# Patient Record
Sex: Male | Born: 1965
Health system: Southern US, Community
[De-identification: ages and names within clinical notes are randomized; demographics above are authoritative.]

## PROBLEM LIST (undated history)

## (undated) DIAGNOSIS — Z8489 Family history of other specified conditions: Secondary | ICD-10-CM

## (undated) DIAGNOSIS — J189 Pneumonia, unspecified organism: Secondary | ICD-10-CM

## (undated) DIAGNOSIS — N433 Hydrocele, unspecified: Secondary | ICD-10-CM

## (undated) DIAGNOSIS — F419 Anxiety disorder, unspecified: Secondary | ICD-10-CM

## (undated) DIAGNOSIS — M4712 Other spondylosis with myelopathy, cervical region: Secondary | ICD-10-CM

## (undated) HISTORY — DX: Hydrocele, unspecified: N43.3

---

## 1999-01-30 ENCOUNTER — Emergency Department (HOSPITAL_COMMUNITY): Admission: EM | Admit: 1999-01-30 | Discharge: 1999-01-30 | Payer: Self-pay | Admitting: Emergency Medicine

## 1999-02-26 ENCOUNTER — Emergency Department (HOSPITAL_COMMUNITY): Admission: EM | Admit: 1999-02-26 | Discharge: 1999-02-26 | Payer: Self-pay | Admitting: Emergency Medicine

## 2001-06-13 ENCOUNTER — Emergency Department (HOSPITAL_COMMUNITY): Admission: EM | Admit: 2001-06-13 | Discharge: 2001-06-13 | Payer: Self-pay | Admitting: Emergency Medicine

## 2001-06-23 ENCOUNTER — Emergency Department (HOSPITAL_COMMUNITY): Admission: EM | Admit: 2001-06-23 | Discharge: 2001-06-23 | Payer: Self-pay | Admitting: Emergency Medicine

## 2005-02-07 ENCOUNTER — Emergency Department (HOSPITAL_COMMUNITY): Admission: EM | Admit: 2005-02-07 | Discharge: 2005-02-07 | Payer: Self-pay | Admitting: Emergency Medicine

## 2010-02-11 DIAGNOSIS — N433 Hydrocele, unspecified: Secondary | ICD-10-CM

## 2010-02-11 HISTORY — DX: Hydrocele, unspecified: N43.3

## 2010-03-10 ENCOUNTER — Encounter: Admission: RE | Admit: 2010-03-10 | Discharge: 2010-03-10 | Payer: Self-pay | Admitting: Family Medicine

## 2014-03-30 ENCOUNTER — Ambulatory Visit: Payer: Self-pay | Admitting: Family Medicine

## 2014-03-30 VITALS — BP 146/90 | HR 96 | Temp 98.5°F | Resp 18 | Ht 69.25 in | Wt 222.0 lb

## 2014-03-30 DIAGNOSIS — N433 Hydrocele, unspecified: Secondary | ICD-10-CM

## 2014-03-30 NOTE — Patient Instructions (Signed)
HPI This chart was scribed for Paul Morgan by Paul Morgan, Scribe. This patient was seen in room 8 and the patient's care was started at 10:15 AM.  HPI Comments: Paul Morgan is a 48 y.o. male who presents to the [facility] for follow up of a problem that he had with his DOT approval. 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 He states that he had a known hydrocele but the company which he began working for told him that he would not be approved because they thought that he had a hernia instead. He states that he has no hernia and would like to have a note written stating that there is no hernia present.   Past Medical History  Diagnosis Date  . Hydrocele 02/2010    No Known Allergies  Meds ordered this encounter  Medications  . loratadine-pseudoephedrine (CLARITIN-D 12-HOUR) 5-120 MG per tablet    Sig: Take 1 tablet by mouth as needed for allergies.   Review of Systems  Constitutional: Negative for fever and chills.  Respiratory: Negative for cough and shortness of breath.   Cardiovascular: Negative for chest pain.  Gastrointestinal: Negative for abdominal pain.  Musculoskeletal: Negative for back pain.       Objective:   Physical Exam Nursing note and vitals reviewed. Constitutional: Patient is oriented to person, place, and time. Patient appears well-developed and well-nourished. No distress.  HENT: Moderate gingival retraction, no acute abnormality of the mouth, eyes, ears, or neck Head: Normocephalic and atraumatic.  Neck: Neck supple. No tracheal deviation present.  Cardiovascular: Normal rate, regular rhythm and normal heart sounds.   No murmur heard. Pulmonary/Chest: Effort normal and breath sounds normal. No respiratory distress. Patient has no wheezes. Patient has no rales.  Musculoskeletal: Normal range of motion.  Neurological: Patient is alert and oriented to person, place, and time.  Skin: Skin is warm and dry.   Psychiatric: Patient has a normal mood and affect. Patient's behavior is normal.  Genitourinary: Patient has no hernias present. He does have a hydrocele measuring approximately 6 cm on the right. This is nontender and patient demonstrates full range of motion of his torso without pain.  Triage Vitals: BP 148/98  Pulse 96  Temp(Src) 98.5 F (36.9 C) (Oral)  Resp 18  Ht 5' 9.25" (1.759 m)  Wt 222 lb (100.699 kg)  BMI 32.55 kg/m2  SpO2 99%     Assessment & Plan:  DIAGNOSTIC STUDIES: Oxygen Saturation is 99% on room air, normal by my interpretation.    COORDINATION OF CARE: At 1015 Discussed treatment plan with patient. Patient agrees.   I personally performed the services described in this documentation, which was scribed in my presence. The recorded information has been reviewed and is accurate.   I have no reservations about this patient driving for 2 years.

## 2014-03-30 NOTE — Progress Notes (Signed)
   Subjective:    Patient ID: Paul Morgan, male    DOB: June 25, 1966, 48 y.o.   MRN: [id number]  HPI This chart was scribed for Robyn Haber by Lovena Le Laini Urick, Scribe. This patient was seen in room 8 and the patient's care was started at 10:15 AM.  HPI Comments: Paul Morgan is a 48 y.o. male who presents to the [facility] for follow up of a problem that he had with his DOT approval. 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 He states that he had a known hydrocele but the company which he began working for told him that he would not be approved because they thought that he had a hernia instead. He states that he has no hernia and would like to have a note written stating that there is no hernia present.   Past Medical History  Diagnosis Date  . Hydrocele 02/2010    No Known Allergies  Meds ordered this encounter  Medications  . loratadine-pseudoephedrine (CLARITIN-D 12-HOUR) 5-120 MG per tablet    Sig: Take 1 tablet by mouth as needed for allergies.   Review of Systems  Constitutional: Negative for fever and chills.  Respiratory: Negative for cough and shortness of breath.   Cardiovascular: Negative for chest pain.  Gastrointestinal: Negative for abdominal pain.  Musculoskeletal: Negative for back pain.       Objective:   Physical Exam Nursing note and vitals reviewed. Constitutional: Patient is oriented to person, place, and time. Patient appears well-developed and well-nourished. No distress.  HENT: Moderate gingival retraction, no acute abnormality of the mouth, eyes, ears, or neck Head: Normocephalic and atraumatic.  Neck: Neck supple. No tracheal deviation present.  Cardiovascular: Normal rate, regular rhythm and normal heart sounds.   No murmur heard. Pulmonary/Chest: Effort normal and breath sounds normal. No respiratory distress. Patient has no wheezes. Patient has no rales.  Musculoskeletal: Normal range of motion.    Neurological: Patient is alert and oriented to person, place, and time.  Skin: Skin is warm and dry.  Psychiatric: Patient has a normal mood and affect. Patient's behavior is normal.  Genitourinary: Patient has no hernias present. He does have a hydrocele measuring approximately 6 cm on the right. This is nontender and patient demonstrates full range of motion of his torso without pain.  Triage Vitals: BP 148/98  Pulse 96  Temp(Src) 98.5 F (36.9 C) (Oral)  Resp 18  Ht 5' 9.25" (1.759 m)  Wt 222 lb (100.699 kg)  BMI 32.55 kg/m2  SpO2 99%     Assessment & Plan:  DIAGNOSTIC STUDIES: Oxygen Saturation is 99% on room air, normal by my interpretation.    COORDINATION OF CARE: At 1015 Discussed treatment plan with patient. Patient agrees.   I personally performed the services described in this documentation, which was scribed in my presence. The recorded information has been reviewed and is accurate.   I have no reservations about this patient driving for 2 years.

## 2014-08-06 ENCOUNTER — Ambulatory Visit (INDEPENDENT_AMBULATORY_CARE_PROVIDER_SITE_OTHER): Payer: BC Managed Care – PPO | Admitting: Family Medicine

## 2014-08-06 VITALS — BP 148/90 | HR 109 | Temp 98.2°F | Resp 18 | Ht 69.5 in | Wt 222.6 lb

## 2014-08-06 DIAGNOSIS — N433 Hydrocele, unspecified: Secondary | ICD-10-CM

## 2014-08-06 DIAGNOSIS — R82998 Other abnormal findings in urine: Secondary | ICD-10-CM

## 2014-08-06 DIAGNOSIS — R8290 Unspecified abnormal findings in urine: Secondary | ICD-10-CM

## 2014-08-06 DIAGNOSIS — R5381 Other malaise: Secondary | ICD-10-CM

## 2014-08-06 DIAGNOSIS — R5383 Other fatigue: Secondary | ICD-10-CM

## 2014-08-06 DIAGNOSIS — N5089 Other specified disorders of the male genital organs: Secondary | ICD-10-CM

## 2014-08-06 DIAGNOSIS — Z113 Encounter for screening for infections with a predominantly sexual mode of transmission: Secondary | ICD-10-CM

## 2014-08-06 LAB — POCT URINALYSIS DIPSTICK
Bilirubin, UA: NEGATIVE
Blood, UA: NEGATIVE
Glucose, UA: NEGATIVE
Ketones, UA: NEGATIVE
Leukocytes, UA: NEGATIVE
Nitrite, UA: NEGATIVE
Protein, UA: 30
Spec Grav, UA: 1.015
Urobilinogen, UA: 0.2
pH, UA: 5.5

## 2014-08-06 LAB — COMPLETE METABOLIC PANEL WITH GFR
ALT: 25 U/L (ref 0–53)
Albumin: 4.4 g/dL (ref 3.5–5.2)
Alkaline Phosphatase: 60 U/L (ref 39–117)
BUN: 10 mg/dL (ref 6–23)
Calcium: 9.9 mg/dL (ref 8.4–10.5)
Chloride: 105 mEq/L (ref 96–112)
Creat: 0.96 mg/dL (ref 0.50–1.35)
Glucose, Bld: 102 mg/dL — ABNORMAL HIGH (ref 70–99)
Sodium: 140 mEq/L (ref 135–145)
Total Bilirubin: 0.5 mg/dL (ref 0.2–1.2)
Total Protein: 7.8 g/dL (ref 6.0–8.3)

## 2014-08-06 LAB — POCT CBC
Granulocyte percent: 59.8 %G (ref 37–80)
HCT, POC: 44.9 % (ref 43.5–53.7)
Hemoglobin: 13.7 g/dL — AB (ref 14.1–18.1)
Lymph, poc: 2.8 (ref 0.6–3.4)
MCH, POC: 25.1 pg — AB (ref 27–31.2)
MCHC: 30.5 g/dL — AB (ref 31.8–35.4)
MCV: 82.1 fL (ref 80–97)
MID (cbc): 0.5 (ref 0–0.9)
MPV: 7 fL (ref 0–99.8)
POC Granulocyte: 4.8 (ref 2–6.9)
POC LYMPH PERCENT: 34.2 %L (ref 10–50)
POC MID %: 6 %M (ref 0–12)
Platelet Count, POC: 315 10*3/uL (ref 142–424)
RBC: 5.47 M/uL (ref 4.69–6.13)
RDW, POC: 14.5 %
WBC: 8.1 10*3/uL (ref 4.6–10.2)

## 2014-08-06 LAB — POCT UA - MICROSCOPIC ONLY
Bacteria, U Microscopic: NEGATIVE
Casts, Ur, LPF, POC: NEGATIVE
Crystals, Ur, HPF, POC: NEGATIVE
Mucus, UA: NEGATIVE
RBC, urine, microscopic: NEGATIVE
Yeast, UA: NEGATIVE

## 2014-08-06 LAB — COMPLETE METABOLIC PANEL WITHOUT GFR
AST: 20 U/L (ref 0–37)
CO2: 26 meq/L (ref 19–32)
GFR, Est African American: >89 mL/min
GFR, Est Non African American: >89 mL/min
Potassium: 4.6 meq/L (ref 3.5–5.3)

## 2014-08-06 NOTE — Patient Instructions (Signed)
Hydrocele, Adult Fluid can collect around the testicles. This fluid forms in a sac. This condition is called a hydrocele. The collected fluid causes swelling of the scrotum. Usually, it affects just one testicle. Most of the time, the condition does not cause pain. Sometimes, the hydrocele goes away on its own. Other times, surgery is needed to get rid of the fluid. CAUSES A hydrocele does not develop often. Different things can cause a hydrocele in a man, including:  Injury to the scrotum.  Infection.  X-ray of the area around the scrotum.  A tumor or cancer of the testicle.  Twisting of a testicle.  Decreased blood flow to the scrotum. SYMPTOMS   Swelling without pain. The hydrocele feels like a water-filled balloon.  Swelling with pain. This can occur if the hydrocele was caused by infection or twisting.  Mild discomfort in the scrotum.  The hydrocele may feel heavy.  Swelling that gets smaller when you lie down. DIAGNOSIS  Your caregiver will do a physical exam to decide if you have a hydrocele. This may include:  Asking questions about your overall health, today and in the past. Your caregiver may ask about any injuries, X-rays, or infections.  Pushing on your abdomen or asking you to change positions to see if the size of the hydrocele changes.  Shining a light through the scrotum (transillumination) to see if the fluid inside the scrotum is clear.  Blood tests and urine tests to check for infection.  Imaging studies that take pictures of the scrotum and testicles. TREATMENT  Treatment depends in part on what caused the condition. Options include:  Watchful waiting. Your caregiver checks the hydrocele every so often.  Different surgeries to drain the fluid.  A needle may be put into the scrotum to drain fluid (needle aspiration). Fluid often returns after this type of treatment.  A cut (incision) may be made in the scrotum to remove the fluid sac  (hydrocelectomy).  An incision may be made in the groin to repair a hydrocele that has contact with abdominal fluids (communicating hydrocele).  Medicines to treat an infection (antibiotics). HOME CARE INSTRUCTIONS  What you need to do at home may depend on the cause of the hydrocele and type of treatment. In general:  Take all medicine as directed by your caregiver. Follow the directions carefully.  Ask your caregiver if there is anything you should not do while you recover (activities, lifting, work, sex).  If you had surgery to repair a communicating hydrocele, recovery time may vary. Ask you caregiver about your recovery time.  Avoid heavy lifting for 4 to 6 weeks.  If you had an incision on the scrotum or groin, wash it for 2 to 3 days after surgery. Do this as long as the skin is closed and there are no gaps in the wound. Wash gently, and avoid rubbing the incision.  Keep all follow-up appointments. SEEK MEDICAL CARE IF:   Your scrotum seems to be getting larger.  The area becomes more and more uncomfortable. SEEK IMMEDIATE MEDICAL CARE IF:  You have a fever. Document Released: 05/20/2010 Document Revised: 09/20/2013 Document Reviewed: 05/20/2010 Exit[employer] Patient Information 2015 [employer]. This information is not intended to replace advice given to you by your health care provider. Make sure you discuss any questions you have with your health care provider.  

## 2014-08-06 NOTE — Progress Notes (Signed)
 Chief Complaint:  Chief Complaint  Patient presents with  . Follow-up    Hyrdrocele, says it has gotten larger, uncomfortable feeling    HPI: Paul Morgan is a 48 y.o. male who is here for a l2 week history of worsening swellling and enlargement of  His hydrocele He would like a professional referral, He has more fluid trying to get fluids because the sac is full and he is having problems. He has a discharge every now and then , it is not clear, like pus almost.  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 So he would like to be seen in next couple of days or after next Friday No fevers or chills but has had fatigue Prior US from 03/10/10 is below.   IMPRESSION:  1. No intratesticular abnormality. There is blood flow to both  testicles.  2. Bilateral hydroceles, left greater than right.  Provider: Ulyess Mort   Past Medical History  Diagnosis Date  . Hydrocele 02/2010   History reviewed. No pertinent past surgical history. History   Social History  . Marital Status: Single    Spouse Name: N/A    Number of Children: N/A  . Years of Education: N/A   Social History Main Topics  . Smoking status: Current Every Day Smoker -- 0.50 packs/day    Types: Cigarettes    Start date: 03/30/1997  . Smokeless tobacco: Never Used  . Alcohol Use: None  . Drug Use: None  . Sexual Activity: None   Other Topics Concern  . None   Social History Narrative  . None   Family History  Problem Relation Age of Onset  . Diabetes Mother   . COPD Mother   . Cancer Father   . Heart disease Sister   . Drug abuse Brother   . Diabetes Brother   . COPD Brother   . Cancer Sister   . Diabetes Brother    No Known Allergies Prior to Admission medications   Medication Sig Start Date End Date Taking? Authorizing Provider  loratadine-pseudoephedrine (CLARITIN-D 12-HOUR) 5-120 MG per tablet Take 1 tablet by mouth as needed for allergies.   Yes Historical  Provider, MD     ROS: The patient denies fevers, chills, night sweats, unintentional weight loss, chest pain, palpitations, wheezing, dyspnea on exertion, nausea, vomiting, abdominal pain, dysuria, hematuria, melena, numbness, weakness, or tingling.   All other systems have been reviewed and were otherwise negative with the exception of those mentioned in the HPI and as above.    PHYSICAL EXAM: Filed Vitals:   08/06/14 0939  BP: 148/90  Pulse: 109  Temp: 98.2 F (36.8 C)  Resp: 18   Filed Vitals:   08/06/14 0939  Height: 5' 9.5" (1.765 m)  Weight: 222 lb 9.6 oz (100.971 kg)   Body mass index is 32.41 kg/(m^2).  General: Alert, no acute distress HEENT:  Normocephalic, atraumatic, oropharynx patent. EOMI, PERRLA Cardiovascular:  Regular rate and rhythm, no rubs murmurs or gallops.  No Carotid bruits, radial pulse intact. No pedal edema.  Respiratory: Clear to auscultation bilaterally.  No wheezes, rales, or rhonchi.  No cyanosis, no use of accessory musculature GI: No organomegaly, abdomen is soft and non-tender, positive bowel sounds.  No masses. Skin: No rashes. Neurologic: Facial musculature symmetric. Psychiatric: Patient is appropriate throughout our interaction. Lymphatic: No cervical lymphadenopathy Musculoskeletal: Gait intact. + large bialteral hydrocele, there is tightness to the skin  due to distension of the swelling he has leakage of his hydrocele? There is transillumination with light He has tenderness with palpation  Bbut non ewithout palpation No skin discoloration His penis is not visibibly seen due to swelling unless the scrotum is pulled back  No obvios hard nodules but difficult to determine  LABS: Results for orders placed in visit on 08/06/14  POCT UA - MICROSCOPIC ONLY      Result Value Ref Range   WBC, Ur, HPF, POC 0-3     RBC, urine, microscopic neg     Bacteria, U Microscopic neg     Mucus, UA neg     Epithelial cells, urine per micros 0-1      Crystals, Ur, HPF, POC neg     Casts, Ur, LPF, POC neg     Yeast, UA neg    POCT URINALYSIS DIPSTICK      Result Value Ref Range   Color, UA yellow     Clarity, UA clear     Glucose, UA neg     Bilirubin, UA neg     Ketones, UA neg     Spec Grav, UA 1.015     Blood, UA neg     pH, UA 5.5     Protein, UA 30     Urobilinogen, UA 0.2     Nitrite, UA neg     Leukocytes, UA Negative    POCT CBC      Result Value Ref Range   WBC 8.1  4.6 - 10.2 K/uL   Lymph, poc 2.8  0.6 - 3.4   POC LYMPH PERCENT 34.2  10 - 50 %L   MID (cbc) 0.5  0 - 0.9   POC MID % 6.0  0 - 12 %M   POC Granulocyte 4.8  2 - 6.9   Granulocyte percent 59.8  37 - 80 %G   RBC 5.47  4.69 - 6.13 M/uL   Hemoglobin 13.7 (*) 14.1 - 18.1 g/dL   HCT, POC 44.9  43.5 - 53.7 %   MCV 82.1  80 - 97 fL   MCH, POC 25.1 (*) 27 - 31.2 pg   MCHC 30.5 (*) 31.8 - 35.4 g/dL   RDW, POC 14.5     Platelet Count, POC 315  142 - 424 K/uL   MPV 7.0  0 - 99.8 fL     EKG/XRAY:   Primary read interpreted by Dr. Marin Comment at [facility].   ASSESSMENT/PLAN: Encounter Diagnoses  Name Primary?  . Hydrocele, bilateral Yes  . Other malaise and fatigue   . Abnormal urine   . Scrotal edema   . Screening for STD (sexually transmitted disease)    History of chlamydia Will  treat for any infection if GC is positive  He states he has been monagamous and has not had any + GC in many years.  He will be referred to urology for bilateral hydroceles today or tomorrow, if not then need to get an [place].  F/u prn  Gross sideeffects, risk and benefits, and alternatives of medications d/w patient. Patient is aware that all medications have potential sideeffects and we are unable to predict every sideeffect or drug-drug interaction that may occur.  , Morrow, DO 08/06/2014 1:08 PM

## 2014-08-07 LAB — GC/CHLAMYDIA PROBE AMP
CT Probe RNA: NEGATIVE
GC Probe RNA: NEGATIVE

## 2014-08-07 LAB — HIV ANTIBODY (ROUTINE TESTING W REFLEX): HIV 1&2 Ab, 4th Generation: NONREACTIVE

## 2014-08-08 ENCOUNTER — Telehealth: Payer: Self-pay | Admitting: Family Medicine

## 2014-08-08 NOTE — Telephone Encounter (Signed)
Spoke with patient about labs, he has seen urology and has been given PCN, he is scheduled for surgery when he hears from the urology office

## 2014-08-10 ENCOUNTER — Other Ambulatory Visit: Payer: Self-pay | Admitting: Urology

## 2014-08-17 ENCOUNTER — Telehealth: Payer: Self-pay | Admitting: Family Medicine

## 2014-08-17 NOTE — Telephone Encounter (Signed)
Error, already spoke to pt about his labs

## 2014-09-11 ENCOUNTER — Encounter (HOSPITAL_COMMUNITY): Payer: Self-pay | Admitting: *Deleted

## 2014-09-11 NOTE — Anesthesia Preprocedure Evaluation (Addendum)
Anesthesia Evaluation  Patient identified by MRN, date of birth, ID band Patient awake    Reviewed: Allergy & Precautions, H&P , NPO status , Patient's Chart, lab work & pertinent test results  Airway Mallampati: II TM Distance: >3 FB Neck ROM: full    Dental no notable dental hx. (+) Teeth Intact, Dental Advisory Given   Pulmonary neg pulmonary ROS, Current Smoker,  breath sounds clear to auscultation  Pulmonary exam normal       Cardiovascular Exercise Tolerance: Good negative cardio ROS  Rhythm:regular Rate:Normal     Neuro/Psych negative neurological ROS  negative psych ROS   GI/Hepatic negative GI ROS, Neg liver ROS,   Endo/Other  negative endocrine ROS  Renal/GU negative Renal ROS  negative genitourinary   Musculoskeletal   Abdominal Normal abdominal exam  (+)   Peds  Hematology negative hematology ROS (+)   Anesthesia Other Findings   Reproductive/Obstetrics negative OB ROS                          Anesthesia Physical Anesthesia Plan  ASA: II  Anesthesia Plan: General   Post-op Pain Management:    Induction: Intravenous  Airway Management Planned: LMA  Additional Equipment:   Intra-op Plan:   Post-operative Plan:   Informed Consent: I have reviewed the patients History and Physical, chart, labs and discussed the procedure including the risks, benefits and alternatives for the proposed anesthesia with the patient or authorized representative who has indicated his/her understanding and acceptance.   Dental Advisory Given  Plan Discussed with: CRNA and Surgeon  Anesthesia Plan Comments:         Anesthesia Quick Evaluation

## 2014-09-12 ENCOUNTER — Encounter (HOSPITAL_COMMUNITY)
Admission: RE | Disposition: A | Discharge status: Home / Self Care | Payer: Self-pay | Source: Ambulatory Visit | Attending: Urology

## 2014-09-12 ENCOUNTER — Ambulatory Visit (HOSPITAL_COMMUNITY): Payer: BC Managed Care – PPO | Admitting: Anesthesiology

## 2014-09-12 ENCOUNTER — Encounter (HOSPITAL_COMMUNITY): Payer: BC Managed Care – PPO | Admitting: Anesthesiology

## 2014-09-12 ENCOUNTER — Encounter (HOSPITAL_COMMUNITY): Payer: Self-pay | Admitting: *Deleted

## 2014-09-12 ENCOUNTER — Ambulatory Visit (HOSPITAL_BASED_OUTPATIENT_CLINIC_OR_DEPARTMENT_OTHER): Admission: RE | Admit: 2014-09-12 | Payer: Self-pay | Source: Ambulatory Visit | Admitting: Urology

## 2014-09-12 ENCOUNTER — Encounter (HOSPITAL_BASED_OUTPATIENT_CLINIC_OR_DEPARTMENT_OTHER): Admission: RE | Payer: Self-pay | Source: Ambulatory Visit

## 2014-09-12 ENCOUNTER — Ambulatory Visit (HOSPITAL_COMMUNITY)
Admission: RE | Admit: 2014-09-12 | Discharge: 2014-09-12 | Disposition: A | Discharge status: Home / Self Care | Payer: BC Managed Care – PPO | Source: Ambulatory Visit | Attending: Urology | Admitting: Urology

## 2014-09-12 DIAGNOSIS — L732 Hidradenitis suppurativa: Secondary | ICD-10-CM | POA: Insufficient documentation

## 2014-09-12 DIAGNOSIS — F17200 Nicotine dependence, unspecified, uncomplicated: Secondary | ICD-10-CM | POA: Insufficient documentation

## 2014-09-12 DIAGNOSIS — N432 Other hydrocele: Secondary | ICD-10-CM | POA: Insufficient documentation

## 2014-09-12 DIAGNOSIS — D176 Benign lipomatous neoplasm of spermatic cord: Secondary | ICD-10-CM | POA: Diagnosis not present

## 2014-09-12 HISTORY — DX: Pneumonia, unspecified organism: J18.9

## 2014-09-12 HISTORY — PX: HYDROCELE EXCISION: SHX482

## 2014-09-12 LAB — CBC
HEMATOCRIT: 40.8 % (ref 39.0–52.0)
HEMOGLOBIN: 13.1 g/dL (ref 13.0–17.0)
MCH: 25.5 pg — AB (ref 26.0–34.0)
MCHC: 32.1 g/dL (ref 30.0–36.0)
MCV: 79.5 fL (ref 78.0–100.0)
Platelets: 284 10*3/uL (ref 150–400)
RBC: 5.13 MIL/uL (ref 4.22–5.81)
RDW: 13.2 % (ref 11.5–15.5)
WBC: 5.9 10*3/uL (ref 4.0–10.5)

## 2014-09-12 SURGERY — HYDROCELECTOMY
Anesthesia: General | Laterality: Bilateral

## 2014-09-12 MED ORDER — DEXAMETHASONE SODIUM PHOSPHATE 10 MG/ML IJ SOLN
INTRAMUSCULAR | Status: AC
  Filled 2014-09-12: qty 1

## 2014-09-12 MED ORDER — PHENYLEPHRINE 40 MCG/ML (10ML) SYRINGE FOR IV PUSH (FOR BLOOD PRESSURE SUPPORT)
PREFILLED_SYRINGE | INTRAVENOUS | Status: AC
  Filled 2014-09-12: qty 10

## 2014-09-12 MED ORDER — DOCUSATE SODIUM 100 MG PO CAPS: 100.0000 mg | ORAL_CAPSULE | Freq: Two times a day (BID) | ORAL | Status: DC

## 2014-09-12 MED ORDER — MIDAZOLAM HCL 5 MG/5ML IJ SOLN
INTRAMUSCULAR | Status: DC | PRN
  Administered 2014-09-12: 2 mg via INTRAVENOUS

## 2014-09-12 MED ORDER — PHENYLEPHRINE HCL 10 MG/ML IJ SOLN
INTRAMUSCULAR | Status: DC | PRN
  Administered 2014-09-12 (×5): 80 ug via INTRAVENOUS

## 2014-09-12 MED ORDER — LACTATED RINGERS IV SOLN: INTRAVENOUS | Status: DC

## 2014-09-12 MED ORDER — LACTATED RINGERS IV SOLN
INTRAVENOUS | Status: DC | PRN
  Administered 2014-09-12 (×2): via INTRAVENOUS

## 2014-09-12 MED ORDER — KETOROLAC TROMETHAMINE 30 MG/ML IJ SOLN
INTRAMUSCULAR | Status: AC
  Filled 2014-09-12: qty 1

## 2014-09-12 MED ORDER — PROPOFOL 10 MG/ML IV BOLUS
INTRAVENOUS | Status: AC
  Filled 2014-09-12: qty 20

## 2014-09-12 MED ORDER — LIDOCAINE HCL (CARDIAC) 20 MG/ML IV SOLN
INTRAVENOUS | Status: AC
  Filled 2014-09-12: qty 5

## 2014-09-12 MED ORDER — EPHEDRINE SULFATE 50 MG/ML IJ SOLN
INTRAMUSCULAR | Status: DC | PRN
  Administered 2014-09-12: 5 mg via INTRAVENOUS

## 2014-09-12 MED ORDER — FENTANYL CITRATE 0.05 MG/ML IJ SOLN
INTRAMUSCULAR | Status: AC
  Filled 2014-09-12: qty 2

## 2014-09-12 MED ORDER — MIDAZOLAM HCL 2 MG/2ML IJ SOLN
INTRAMUSCULAR | Status: AC
  Filled 2014-09-12: qty 2

## 2014-09-12 MED ORDER — DEXAMETHASONE SODIUM PHOSPHATE 10 MG/ML IJ SOLN
INTRAMUSCULAR | Status: DC | PRN
  Administered 2014-09-12: 10 mg via INTRAVENOUS

## 2014-09-12 MED ORDER — BUPIVACAINE HCL (PF) 0.25 % IJ SOLN
INTRAMUSCULAR | Status: DC | PRN
  Administered 2014-09-12: 20 mL

## 2014-09-12 MED ORDER — EPHEDRINE SULFATE 50 MG/ML IJ SOLN
INTRAMUSCULAR | Status: AC
  Filled 2014-09-12: qty 1

## 2014-09-12 MED ORDER — PHENYLEPHRINE HCL 10 MG/ML IJ SOLN
INTRAMUSCULAR | Status: AC
Start: 2014-09-12 — End: 2014-09-12
  Filled 2014-09-12: qty 1

## 2014-09-12 MED ORDER — ACETAMINOPHEN 10 MG/ML IV SOLN
1000.0000 mg | Freq: Once | INTRAVENOUS | Status: DC
  Filled 2014-09-12: qty 100

## 2014-09-12 MED ORDER — CEFAZOLIN SODIUM-DEXTROSE 2-3 GM-% IV SOLR
INTRAVENOUS | Status: AC
  Filled 2014-09-12: qty 50

## 2014-09-12 MED ORDER — DEXAMETHASONE SODIUM PHOSPHATE 10 MG/ML IJ SOLN
INTRAMUSCULAR | Status: AC
Start: 2014-09-12 — End: 2014-09-12
  Filled 2014-09-12: qty 1

## 2014-09-12 MED ORDER — OXYCODONE-ACETAMINOPHEN 5-325 MG PO TABS: 1.0000 | ORAL_TABLET | ORAL | Status: DC | PRN

## 2014-09-12 MED ORDER — CEFAZOLIN SODIUM-DEXTROSE 2-3 GM-% IV SOLR: 2.0000 g | INTRAVENOUS | Status: DC

## 2014-09-12 MED ORDER — ONDANSETRON HCL 4 MG/2ML IJ SOLN
INTRAMUSCULAR | Status: AC
  Filled 2014-09-12: qty 2

## 2014-09-12 MED ORDER — PROPOFOL 10 MG/ML IV BOLUS
INTRAVENOUS | Status: DC | PRN
  Administered 2014-09-12: 200 mg via INTRAVENOUS

## 2014-09-12 MED ORDER — LIDOCAINE HCL (CARDIAC) 10 MG/ML IV SOLN
INTRAVENOUS | Status: DC | PRN
  Administered 2014-09-12: 100 mg via INTRAVENOUS

## 2014-09-12 MED ORDER — FENTANYL CITRATE 0.05 MG/ML IJ SOLN
INTRAMUSCULAR | Status: DC | PRN
  Administered 2014-09-12: 100 ug via INTRAVENOUS
  Administered 2014-09-12 (×3): 50 ug via INTRAVENOUS

## 2014-09-12 MED ORDER — SODIUM CHLORIDE 0.9 % IJ SOLN
INTRAMUSCULAR | Status: AC
  Filled 2014-09-12: qty 10

## 2014-09-12 MED ORDER — KETOROLAC TROMETHAMINE 30 MG/ML IJ SOLN
INTRAMUSCULAR | Status: DC | PRN
  Administered 2014-09-12: 30 mg via INTRAVENOUS

## 2014-09-12 MED ORDER — BUPIVACAINE HCL (PF) 0.25 % IJ SOLN
INTRAMUSCULAR | Status: AC
  Filled 2014-09-12: qty 30

## 2014-09-12 MED ORDER — ONDANSETRON HCL 4 MG/2ML IJ SOLN
INTRAMUSCULAR | Status: DC | PRN
  Administered 2014-09-12: 4 mg via INTRAVENOUS

## 2014-09-12 MED ORDER — ACETAMINOPHEN 10 MG/ML IV SOLN
INTRAVENOUS | Status: DC | PRN
  Administered 2014-09-12: 1000 mg via INTRAVENOUS

## 2014-09-12 MED ORDER — HYDROMORPHONE HCL 1 MG/ML IJ SOLN: 0.2500 mg | INTRAMUSCULAR | Status: DC | PRN

## 2014-09-12 MED ORDER — FENTANYL CITRATE 0.05 MG/ML IJ SOLN
INTRAMUSCULAR | Status: AC
  Filled 2014-09-12: qty 5

## 2014-09-12 SURGICAL SUPPLY — 25 items
ADH SKN CLS APL DERMABOND .7 (GAUZE/BANDAGES/DRESSINGS) ×1
BNDG GAUZE ELAST 4 BULKY (GAUZE/BANDAGES/DRESSINGS) ×1 IMPLANT
COVER SURGICAL LIGHT HANDLE (MISCELLANEOUS) ×2 IMPLANT
DERMABOND ADVANCED (GAUZE/BANDAGES/DRESSINGS) ×1
DERMABOND ADVANCED .7 DNX12 (GAUZE/BANDAGES/DRESSINGS) IMPLANT
DRAPE LAPAROTOMY T 102X78X121 (DRAPES) ×2 IMPLANT
ELECT REM PT RETURN 9FT ADLT (ELECTROSURGICAL) ×2
ELECTRODE REM PT RTRN 9FT ADLT (ELECTROSURGICAL) ×1 IMPLANT
GAUZE SPONGE 4X4 16PLY XRAY LF (GAUZE/BANDAGES/DRESSINGS) ×2 IMPLANT
GLOVE BIOGEL M STRL SZ7.5 (GLOVE) ×8 IMPLANT
GOWN STRL REUS W/TWL LRG LVL3 (GOWN DISPOSABLE) ×4 IMPLANT
KIT BASIN OR (CUSTOM PROCEDURE TRAY) ×2 IMPLANT
NEEDLE HYPO 22GX1.5 SAFETY (NEEDLE) ×1 IMPLANT
NS IRRIG 1000ML POUR BTL (IV SOLUTION) ×2 IMPLANT
PACK GENERAL/GYN (CUSTOM PROCEDURE TRAY) ×3 IMPLANT
SPONGE LAP 4X18 X RAY DECT (DISPOSABLE) ×2 IMPLANT
SUPPORT SCROTAL LG STRP (MISCELLANEOUS) IMPLANT
SUPPORT SCROTAL MED ADLT STRP (MISCELLANEOUS) IMPLANT
SUT CHROMIC 3 0 SH 27 (SUTURE) IMPLANT
SUT CHROMIC 4 0 RB 1X27 (SUTURE) IMPLANT
SUT MNCRL AB 4-0 PS2 18 (SUTURE) ×1 IMPLANT
SUT VIC AB 3-0 SH 27 (SUTURE) ×8
SUT VIC AB 3-0 SH 27XBRD (SUTURE) IMPLANT
SYR CONTROL 10ML LL (SYRINGE) ×1 IMPLANT
WATER STERILE IRR 1500ML POUR (IV SOLUTION) IMPLANT

## 2014-09-12 NOTE — Transfer of Care (Signed)
Immediate Anesthesia Transfer of Care Note  Patient: Paul Morgan  Procedure(s) Performed: Procedure(s): BILATERAL HYDROCELECTOMY ADULT (Bilateral)  Patient Location: PACU  Anesthesia Type:General  Level of Consciousness: awake, alert , oriented and patient cooperative  Airway & Oxygen Therapy: Patient Spontanous Breathing and Patient connected to face mask oxygen  Post-op Assessment: Report given to PACU RN, Post -op Vital signs reviewed and stable and Patient moving all extremities X 4  Post vital signs: stable  Complications: No apparent anesthesia complications

## 2014-09-12 NOTE — H&P (Signed)
Reason For Visit Bilateral hydroceles   History of Present Illness This 48 year old male referred by Dr. Rikki Spearing for evaluation and management of bilateral hydroceles.  The patient initially noted this in 2011 when he had physical exam. At that point he underwent an ultrasound confirming bilateral hydroceles left greater than right. Since 2011 these have progressed. 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 The also have become large enough to cause his penis to become buried within the bilateral masses. His scrotum also interferes with his ability to have intercourse. He denies any testicular pain. He denies any history of trauma or inflammation/infection.  The patient also has chronically draining sinus these bilateral inguinal region. The patient was concerned that his hydroceles were leaking, when in fact it was actually sinuses on his medial thighs. These are not painful but are draining more recently.  The patient has no surgical history are significant past medical history.   Past Medical History Problems  1. History of No significant past medical history  Surgical History Problems  1. History of No Surgical Problems  Current Meds 1. Claritin 10 MG Oral Tablet;  Therapy: (Recorded:25Aug2015) to Recorded  Allergies Medication  1. No Known Drug Allergies  Family History Problems  1. Family history of Death of family member : Mother, Father 2. Family history of Drug abuse : Brother 3. Family history of chronic obstructive pulmonary disease (V17.6) : Mother 4. Family history of diabetes mellitus (V18.0) : Mother 5. Family history of malignant neoplasm (V16.9) : Father 6. Family history of prostate cancer (A35.57) : Father  Social History Problems    Denied: History of Alcohol use   Caffeine use (V49.89)   1 per day   Current every day smoker (305.1)   Married   Occupation   Administrator  Review of Systems Genitourinary,  constitutional, skin, eye, otolaryngeal, hematologic/lymphatic, cardiovascular, pulmonary, endocrine, musculoskeletal, gastrointestinal, neurological and psychiatric system(s) were reviewed and pertinent findings if present are noted.  Genitourinary: urinary frequency and nocturia.  Constitutional: feeling tired (fatigue).  Hematologic/Lymphatic: swollen glands.  Musculoskeletal: joint pain.    Vitals Vital Signs [Data Includes: Last 1 Day]  Recorded: 25Aug2015 04:13PM  Weight: 225 lb  BMI Calculated: 32.75 BSA Calculated: 2.18 Blood Pressure: 137 / 90 Temperature: 98.3 F Heart Rate: 103 Recorded: 25Aug2015 04:03PM  Height: 5 ft 9.5 in Weight: 222 lb  BMI Calculated: 32.31 BSA Calculated: 2.17  Physical Exam Constitutional: Well nourished and well developed . No acute distress.  ENT:. The ears and nose are normal in appearance.  Neck: The appearance of the neck is normal and no neck mass is present.  Pulmonary: No respiratory distress and normal respiratory rhythm and effort.  Cardiovascular: Heart rate and rhythm are normal . No peripheral edema.  Abdomen: The abdomen is soft and nontender. No masses are palpated. No CVA tenderness. No hernias are palpable. No hepatosplenomegaly noted.  Rectal: Rectal exam demonstrates normal sphincter tone, no tenderness and no masses. The prostate has no nodularity and is not tender. The left seminal vesicle is nonpalpable. The right seminal vesicle is nonpalpable. The perineum is normal on inspection.  Genitourinary: Examination of the penis demonstrates no discharge, no masses, no lesions and a normal meatus. The penis is circumcised. The scrotum is without lesions. Examination of the right scrotum demonstrates a hydrocele. Examination of the left scrotum demostrates a hydrocele. The right epididymis is palpably normal and non-tender. The left epididymis is palpably normal and non-tender. The  right testis is nonpalpable, non-tender and without  masses. The left testis is nonpalpable, non-tender and without masses.  Lymphatics: The femoral and inguinal nodes are not enlarged or tender.  Skin: Normal skin turgor, no visible rash and no visible skin lesions . Chronic sinus drainage with no evidence of cellulitis bilateral medial thighs consistent with hidradenitis.  Neuro/Psych:. Mood and affect are appropriate.    Results/Data Urine [Data Includes: Last 1 Day]   25Aug2015  COLOR YELLOW   APPEARANCE CLEAR   SPECIFIC GRAVITY 1.010   pH 6.0   GLUCOSE NEG mg/dL  BILIRUBIN NEG   KETONE NEG mg/dL  BLOOD NEG   PROTEIN NEG mg/dL  UROBILINOGEN 0.2 mg/dL  NITRITE NEG   LEUKOCYTE ESTERASE NEG    The patient's urinalysis today reveals no evidence of infection, inflammation, or microscopic hematuria  Patient's scrotal ultrasound from 2011 was reviewed confirming bilateral hydroceles   Assessment Assessed  1. Hydrocele of testis (603.9)  Bilateral hydroceles left greater than right, both are large with at least 400 cc of fluid.  Hidradenitis and medial thigh bilaterally   Plan Health Maintenance  1. UA With REFLEX; [Do Not Release]; Status:Complete;   Done: [id number] 03:55PM Hydrocele of testis  2. Start: Amoxicillin-Pot Clavulanate 875-125 MG Oral Tablet (Augmentin); TAKE 1 TABLET  EVERY 12 HOURS UNTIL GONE 3. Follow-up Schedule Surgery Office  Follow-up  Status: Hold For - Appointment   Requested for: [phone]  Discussion/Summary I went over the treatment options for the patient's hydroceles. Given that he is symptomatic I think it is worked surgical excision. We did also discuss aspiration, I don't think is a good option for him given the risk of recurrence. I went over the surgery of hydrocelectomy in detail. We talked about the risks and benefits of the operation. Given the bilateral nature of the procedure I would consider leaving a Penrose drain overnight which I discussed with the patient. I told him that the  postoperative course requires approximately 6 weeks for all the inflammation and swelling to resolve. 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  For the hidradenitis had given the patient 7 days of Augmentin. I suspect this will decrease the patient's drainage although I did tell him that he would require surgical excision to completely remove them.

## 2014-09-12 NOTE — Discharge Instructions (Signed)
Discharge instructions following scrotal surgery  Call your doctor for:  Fever is greater than 100.5  Severe nausea or vomiting  Increasing pain not controlled by pain medication  Increasing redness or drainage from incisions  The number for questions or concerns is [phone]  Activity level: No lifting greater than 10 pounds (about equal to milk) for the next 2 weeks or until cleared to do so at follow-up appointment.  Otherwise activity as tolerated by comfort level.  Diet: May resume your regular diet as tolerated  Driving: No driving while still taking opiate pain medications (weight at least 6-8 hours after last dose).  No driving if you still sore from surgery as it may limit her ability to react quickly if necessary.   Shower/bath: May shower and get incision wet pad dry immediately following.  Do not scrub vigorously for the next 2-3 weeks.  Do not soak incision (ID soaking in bath or swimming) until told he may do so by Dr., as this may promote a wound infection.  Wound care: He may cover wounds with sterile gauze as needed to prevent incisions rubbing on close follow-up in any seepage.  Where tight fitting underpants for at least 2 weeks.  He should apply cold compresses (ice or sac of frozen peas/corn) to your scrotum for at least 48 hours to reduce the swelling.  You should expect that his scrotum will swell up initially and then get smaller over the next 2-4 weeks.  Follow-up appointments: Follow-up appointment as scheduled with Dr. Louis Meckel on 10/25/14 at 10:15 am

## 2014-09-12 NOTE — Anesthesia Postprocedure Evaluation (Signed)
  Anesthesia Post-op Note  Patient: Paul Morgan  Procedure(s) Performed: Procedure(s) (LRB): BILATERAL HYDROCELECTOMY ADULT (Bilateral)  Patient Location: PACU  Anesthesia Type: General  Level of Consciousness: awake and alert   Airway and Oxygen Therapy: Patient Spontanous Breathing  Post-op Pain: mild  Post-op Assessment: Post-op Vital signs reviewed, Patient's Cardiovascular Status Stable, Respiratory Function Stable, Patent Airway and No signs of Nausea or vomiting  Last Vitals:  Filed Vitals:   09/12/14 0945  BP: 107/66  Pulse: 86  Temp:   Resp: 18    Post-op Vital Signs: stable   Complications: No apparent anesthesia complications

## 2014-09-12 NOTE — Op Note (Signed)
Preoperative diagnosis:  1. Bilateral hydroceles   Postoperative diagnosis:  1. As above   Procedure: 1. Bilateral hydrocelectomy  Surgeon: Ardis Hughs, MD Resident assistant: Dr. Amaryllis Dyke, M.D.  Anesthesia: General  Complications: None  Intraoperative findings: Approximately 250 cc hydrocele on the patient's right side. There is approximately a 500 cc hydrocele on the patient's left side. The patient's left spermatic cord was thickened and had a large cord lipoma. No hernia sac was identified. The patient was also noted to have nodular hidradenitis in his groin region bilaterally. This was draped off the field.  EBL: 96mL  Specimens: None  Indication: Paul Morgan is a 48 y.o. patient with symptomatic bilateral hydroceles.  After reviewing the management options for treatment, he elected to proceed with the above surgical procedure(s). We have discussed the potential benefits and risks of the procedure, side effects of the proposed treatment, the likelihood of the patient achieving the goals of the procedure, and any potential problems that might occur during the procedure or recuperation. Informed consent has been obtained.  Description of procedure:  The patient was taken to the operating room and general anesthesia was induced. The patient was then prepped and draped in the routine sterile fashion. Timeout was then held in appropriate antibiotics given. 10 cc of 4% Marcaine was then injected into the midline area where we marked our incision. A 4 cm midline scrotal incision was made through the skin down to the dartos tissue. The right testicle and hydrocele sac were then brought up to the incision and dissection carried down over the right hemiscrotum. Once down through the layers of the dartos the testicle was free from the surrounding layers and brought up through the incision. More careful dissection was then done to remove the remaining dartos layers off of the  tunica vaginalis. Once down to thin layer of tissue to sac was opened and drained. It was then excised in a circumferential manner. The sac was not sent for as a specimen. Hemostasis was then meticulously achieved using electrocautery. The remaining edges of the sac were then everted and sewn along the epididymis and the spermatic cord. Care was taken not to strangulate the cord with a tight closure. The area of the right hemiscrotum was then copiously irrigated and again hemostasis was noted to be adequate. The dartos layers were then closed over the right hemiscrotum. Our attention was then turned to the patient's left hydrocele and the left hemiscrotum was then brought to the midline incision. Dissection was then carried down through the dartos layers onto the hydrocele sac. The dartos layers were then dissected off the hydrocele sac circumferentially. Once down to thin layer the sac was opened and drained. It was then excised in a circumferential manner. Again, the sac was not sent as a specimen. We then worked to achieve hemostasis using electrocautery. The remaining edges of the sac were then everted and sewn along the epididymis and part of the spermatic cord. The cord was noted to be thickened with extensive lipoma. Although no hernia sac was identified. The left hemiscrotum was then copiously irrigated and again hemostasis checked and noted to be excellent. The dartos layers were then again closed with a 3-0 Vicryl in a running locking stitch. The skin was then closed using a 4-0 Monocryl in a running vertical mattress. Once the incision was closed 10 cc of quarter percent Marcaine were then injected into the incision. Dermabond was applied to the incision. Fluffs dressings were then used  to pad the scrotum and the mesh underpants were placed on the patient. The patient was subsequently awakened and returned to the PACU in excellent condition.  Ardis Hughs, M.D.

## 2014-09-13 ENCOUNTER — Encounter (HOSPITAL_COMMUNITY): Payer: Self-pay | Admitting: Urology

## 2016-05-28 ENCOUNTER — Emergency Department (HOSPITAL_COMMUNITY): Payer: Self-pay

## 2016-05-28 ENCOUNTER — Emergency Department (HOSPITAL_COMMUNITY)
Admission: EM | Admit: 2016-05-28 | Discharge: 2016-05-28 | Disposition: A | Discharge status: Home / Self Care | Payer: Self-pay | Attending: Emergency Medicine | Admitting: Emergency Medicine

## 2016-05-28 ENCOUNTER — Encounter (HOSPITAL_COMMUNITY): Payer: Self-pay | Admitting: Emergency Medicine

## 2016-05-28 DIAGNOSIS — Z791 Long term (current) use of non-steroidal anti-inflammatories (NSAID): Secondary | ICD-10-CM | POA: Insufficient documentation

## 2016-05-28 DIAGNOSIS — G9520 Unspecified cord compression: Secondary | ICD-10-CM | POA: Insufficient documentation

## 2016-05-28 DIAGNOSIS — Z7982 Long term (current) use of aspirin: Secondary | ICD-10-CM | POA: Insufficient documentation

## 2016-05-28 DIAGNOSIS — F17210 Nicotine dependence, cigarettes, uncomplicated: Secondary | ICD-10-CM | POA: Insufficient documentation

## 2016-05-28 DIAGNOSIS — Z79899 Other long term (current) drug therapy: Secondary | ICD-10-CM | POA: Insufficient documentation

## 2016-05-28 MED ORDER — PREDNISONE 20 MG PO TABS
60.0000 mg | ORAL_TABLET | Freq: Once | ORAL | Status: AC
  Administered 2016-05-28: 60 mg via ORAL
  Filled 2016-05-28: qty 3

## 2016-05-28 NOTE — Discharge Instructions (Signed)
Please follow-up with Dr. Cyndy Freeze tomorrow morning at 9 AM. If you experience any new or worsening signs or symptoms please return immediately to the emergency room.

## 2016-05-28 NOTE — ED Notes (Signed)
Called MRI & was informed will be 1900 - 1930 before pt will be picked up.  Pt & Merry Proud, PA-C informed.

## 2016-05-28 NOTE — ED Provider Notes (Signed)
CSN: [id number]     Arrival date & time 05/28/16  1447 History   First MD Initiated Contact with Patient 05/28/16 1555     Chief Complaint  Patient presents with  . Numbness   HPI    50 year old male presents today with complaints of numbness. Patient reports that approximately one week ago he had a pain in the posterior neck. He reports the pain felt "sore", as if he had "slept on it wrong". Patient reports that the symptoms dissipated that day and have not returned. He reports that 3 days ago he started experiencing numbness tingling and weakness in his bilateral hands. He reports the sensation is worse over the first 3 digits of both hands, worse on the left. He notes the sensation tracks up his arm on the medial aspect up into his shoulder. He denies any other neurological deficits, headache, neck stiffness, fever, weight loss, rash. He denies any history of the same. Patient notes a very distant history of right-sided numbness on his lower extremity that lasted one day while moving large objects, and resolved on its own.  Patient reports that prior to the onset of neck pain, he was lifting heavy objects as he was moving from a two-story home into a one story. 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 Denies any particular incident that would have caused the neck pain.   Denies any lower extremity sensory, strength, motor deficits  Denies any significant past medical history.  Past Medical History  Diagnosis Date  . Hydrocele 02/2010  . Pneumonia     hx of as a child    Past Surgical History  Procedure Laterality Date  . Hydrocele excision Bilateral 09/12/2014    Procedure: BILATERAL HYDROCELECTOMY ADULT;  Surgeon: Ardis Hughs, MD;  Location: [facility];  Service: Urology;  Laterality: Bilateral;   Family History  Problem Relation Age of Onset  . Diabetes Mother   . COPD Mother   . Cancer Father   . Heart disease Sister   . Drug  abuse Brother   . Diabetes Brother   . COPD Brother   . Cancer Sister   . Diabetes Brother    Social History  Substance Use Topics  . Smoking status: Current Every Day Smoker -- 0.50 packs/day for 16 years    Types: Cigarettes    Start date: 03/30/1997  . Smokeless tobacco: Never Used  . Alcohol Use: Yes     Comment: occasional     Review of Systems  All other systems reviewed and are negative.   Allergies  Review of patient's allergies indicates no known allergies.  Home Medications   Prior to Admission medications   Medication Sig Start Date End Date Taking? Authorizing Provider  Aspirin-Acetaminophen-Caffeine (GOODY HEADACHE PO) Take 1 packet by mouth daily as needed (headaches).   Yes Historical Provider, MD  ibuprofen (ADVIL,MOTRIN) 200 MG tablet Take 200-800 mg by mouth every 6 (six) hours as needed for headache or moderate pain.   Yes Historical Provider, MD  loratadine (CLARITIN) 10 MG tablet Take 10 mg by mouth daily as needed for allergies.    Yes Historical Provider, MD  Multiple Vitamin (MULTIVITAMIN) tablet Take 1 tablet by mouth daily.   Yes Historical Provider, MD   BP 142/96 mmHg  Pulse 86  Temp(Src) 98.1 F (36.7 C) (Oral)  Resp 16  Ht 5\' 9"  (1.753 m)  Wt 99.791 kg  BMI 32.47 kg/m2  SpO2 97%  Physical Exam  Constitutional: He is oriented to person, place, and time. He appears well-developed and well-nourished.  HENT:  Head: Normocephalic and atraumatic.  Eyes: Conjunctivae are normal. Pupils are equal, round, and reactive to light. Right eye exhibits no discharge. Left eye exhibits no discharge. No scleral icterus.  Neck: Normal range of motion. No JVD present. No tracheal deviation present.  Pulmonary/Chest: Effort normal. No stridor.  Musculoskeletal:  No  CTL tenderness to palpation, no redness, warmth to touch. No surround soft tissue abnormalities. UE strength 5/5 full active pain free range of motion. No pain with cervical axial loading or  distraction. Full pain free ROM  Bilateral radial pulses 2 +  Neurological: He is alert and oriented to person, place, and time. He has normal strength. No cranial nerve deficit or sensory deficit. Coordination normal.  Reflex Scores:      Patellar reflexes are 2+ on the right side and 2+ on the left side. Decreased sensation to the bilateral upper extremities along the medial aspect tracking to the hands. Diffuse decreased sensation to the hands, more pronounced along the 1,2,3 digits.  Grip strength 5/5  No lower extremity deficits   Skin: Skin is warm and dry. No rash noted. No erythema. No pallor.  Psychiatric: He has a normal mood and affect. His behavior is normal. Judgment and thought content normal.  Nursing note and vitals reviewed.   ED Course  Procedures (including critical care time) Labs Review Labs Reviewed - No data to display  Imaging Review Mr Cervical Spine Wo Contrast  05/28/2016  CLINICAL DATA:  Bilateral hand numbness EXAM: MRI CERVICAL SPINE WITHOUT CONTRAST TECHNIQUE: Multiplanar, multisequence MR imaging of the cervical spine was performed. No intravenous contrast was administered. COMPARISON:  Cervical radiographs 04/14/2011.  No prior MRI. FINDINGS: Alignment: Mild retrolisthesis C3-4.  Remaining alignment normal. Vertebrae: Negative for fracture or mass. Fatty changes in the bone marrow C3 likely related to disc degeneration at C3-4. Cord: Small area of cord hyperintensity at C4-5. This measures 3 mm. 5 mm area of cord hyperintensity at C5-6. These are felt to be areas of myelomalacia related to spinal stenosis. Cord is not enlarged. Posterior Fossa, vertebral arteries, paraspinal tissues: Negative Disc levels: C2-3:  Negative C3-4: Disc degeneration and spondylosis. Diffuse uncinate spurring left greater than right. Marked left foraminal narrowing and moderate right foraminal narrowing. Mild spinal stenosis. C4-5: Moderate to large central disc protrusion with  upgoing and downgoing disc material. There is associated endplate osteophyte formation diffusely. There is severe spinal stenosis with cord compression. Small hyperintensity in the cord just below the disc space level. Severe foraminal encroachment bilaterally due to uncinate spurring. C5-6: Disc degeneration and spondylosis, left greater than right. Severe left foraminal encroachment and moderate right foraminal encroachment. Moderate spinal stenosis. Cord hyperintensity just below the disc space most consistent with myelomalacia. C6-7:  Negative C7-T1:  Negative IMPRESSION: C3-4 spondylosis with severe left foraminal encroachment and moderate right foraminal encroachment and mild spinal stenosis Severe spinal stenosis at C4-5 with cord compression. Diffuse disc protrusion with associated uncinate spurring. Small area of cord hyperintensity. Severe foraminal encroachment bilaterally Moderate spinal stenosis C5-6. Marked left foraminal encroachment and moderate right foraminal encroachment. Cord hyperintensity bilaterally. Electronically Signed   By: Franchot Gallo M.D.   On: 05/28/2016 19:23   I have personally reviewed and evaluated these images and lab results as part of my medical decision-making.   EKG Interpretation None      MDM   Final diagnoses:  Spinal  cord compression [facility])    Labs:  Imaging: MRI cervical W/O  Consults:  Therapeutics:Prednisone  Discharge Meds:   Assessment/Plan:50 year old male presents today with neurological deficits. MRI showed cord compression, neurosurgery consult at. They personally reviewed the patient's images, instructed to have patient placed in c-collar follow-up tomorrow morning. Patient was given follow-up information, strict return precautions, he verbalizes understanding and agreement today's plan.        Okey Regal, PA-C 05/28/16 2147  Lajean Saver, MD 05/28/16 [phone]

## 2016-05-28 NOTE — ED Notes (Addendum)
Patient states he has had numbness to bilateral arms, and sometimes in the right leg for 3 days. Patient is alert, oriented, ambulatory, in no acute distress at this time. No neuro deficits noted.

## 2016-05-28 NOTE — ED Notes (Signed)
Patient transported to MRI 

## 2016-05-29 ENCOUNTER — Other Ambulatory Visit: Payer: Self-pay | Admitting: Neurological Surgery

## 2016-06-01 ENCOUNTER — Encounter (HOSPITAL_COMMUNITY): Payer: Self-pay | Admitting: *Deleted

## 2016-06-01 NOTE — Progress Notes (Signed)
Pt denies SOB, chest pain, and being under the care of a cardiologist. Pt denies having a stress test, echo and cardiac cath. Pt denies having an EKG and chest x raty within the last year. Pt denies having any labs drawn within the last 6 months. Pt made aware to stop taking Aspirin, vitamins, fish oil and herbal medications. Do not take any NSAIDs ie: Ibuprofen, Advil, Naproxen, BC and Goody Powder ( last dose today per pt ) or any medication containing Aspirin. Pt verbalized understanding of all pre-op instructions.

## 2016-06-01 NOTE — Anesthesia Preprocedure Evaluation (Addendum)
Anesthesia Evaluation  Patient identified by MRN, date of birth, ID band Patient awake    Reviewed: Allergy & Precautions, NPO status , Patient's Chart, lab work & pertinent test results  History of Anesthesia Complications (+) Family history of anesthesia reaction and history of anesthetic complications  Airway Mallampati: II  TM Distance: >3 FB Neck ROM: Limited    Dental  (+) Teeth Intact, Dental Advisory Given   Pulmonary Current Smoker,    Pulmonary exam normal breath sounds clear to auscultation       Cardiovascular negative cardio ROS Normal cardiovascular exam Rhythm:Regular Rate:Normal     Neuro/Psych Cervical spondylosis with myelopathy, BUE numbness, weakness negative psych ROS   GI/Hepatic negative GI ROS, Neg liver ROS,   Endo/Other  Obesity   Renal/GU negative Renal ROS     Musculoskeletal  (+) Arthritis , Osteoarthritis,    Abdominal   Peds  Hematology negative hematology ROS (+)   Anesthesia Other Findings Day of surgery medications reviewed with the patient.  Reproductive/Obstetrics                           Anesthesia Physical Anesthesia Plan  ASA: II  Anesthesia Plan: General   Post-op Pain Management:    Induction: Intravenous  Airway Management Planned: Oral ETT  Additional Equipment: Arterial line  Intra-op Plan:   Post-operative Plan: Extubation in OR  Informed Consent: I have reviewed the patients History and Physical, chart, labs and discussed the procedure including the risks, benefits and alternatives for the proposed anesthesia with the patient or authorized representative who has indicated his/her understanding and acceptance.   Dental advisory given  Plan Discussed with: CRNA  Anesthesia Plan Comments: (Risks/benefits of general anesthesia discussed with patient including risk of damage to teeth, lips, gum, and tongue, nausea/vomiting,  allergic reactions to medications, and the possibility of heart attack, stroke and death.  All patient questions answered.  Patient wishes to proceed.  Arterial line per surgeon request.)       Anesthesia Quick Evaluation

## 2016-06-02 ENCOUNTER — Ambulatory Visit (HOSPITAL_COMMUNITY): Payer: Managed Care, Other (non HMO) | Admitting: Anesthesiology

## 2016-06-02 ENCOUNTER — Ambulatory Visit (HOSPITAL_COMMUNITY)
Admission: RE | Admit: 2016-06-02 | Discharge: 2016-06-02 | Disposition: A | Discharge status: Home / Self Care | Payer: Managed Care, Other (non HMO) | Source: Ambulatory Visit | Attending: Neurological Surgery | Admitting: Neurological Surgery

## 2016-06-02 ENCOUNTER — Encounter (HOSPITAL_COMMUNITY)
Admission: RE | Disposition: A | Discharge status: Home / Self Care | Payer: Self-pay | Source: Ambulatory Visit | Attending: Neurological Surgery

## 2016-06-02 ENCOUNTER — Ambulatory Visit (HOSPITAL_COMMUNITY): Payer: Managed Care, Other (non HMO)

## 2016-06-02 ENCOUNTER — Encounter (HOSPITAL_COMMUNITY): Payer: Self-pay | Admitting: *Deleted

## 2016-06-02 DIAGNOSIS — M4802 Spinal stenosis, cervical region: Secondary | ICD-10-CM | POA: Insufficient documentation

## 2016-06-02 DIAGNOSIS — F17200 Nicotine dependence, unspecified, uncomplicated: Secondary | ICD-10-CM | POA: Diagnosis not present

## 2016-06-02 DIAGNOSIS — G9589 Other specified diseases of spinal cord: Secondary | ICD-10-CM | POA: Insufficient documentation

## 2016-06-02 DIAGNOSIS — M4712 Other spondylosis with myelopathy, cervical region: Secondary | ICD-10-CM | POA: Insufficient documentation

## 2016-06-02 DIAGNOSIS — M50021 Cervical disc disorder at C4-C5 level with myelopathy: Secondary | ICD-10-CM | POA: Insufficient documentation

## 2016-06-02 DIAGNOSIS — Z6832 Body mass index (BMI) 32.0-32.9, adult: Secondary | ICD-10-CM | POA: Insufficient documentation

## 2016-06-02 DIAGNOSIS — E669 Obesity, unspecified: Secondary | ICD-10-CM | POA: Insufficient documentation

## 2016-06-02 DIAGNOSIS — Z7982 Long term (current) use of aspirin: Secondary | ICD-10-CM | POA: Diagnosis not present

## 2016-06-02 DIAGNOSIS — Z419 Encounter for procedure for purposes other than remedying health state, unspecified: Secondary | ICD-10-CM

## 2016-06-02 HISTORY — DX: Family history of other specified conditions: Z84.89

## 2016-06-02 HISTORY — DX: Anxiety disorder, unspecified: F41.9

## 2016-06-02 HISTORY — PX: ANTERIOR CERVICAL DECOMP/DISCECTOMY FUSION: SHX1161

## 2016-06-02 HISTORY — DX: Other spondylosis with myelopathy, cervical region: M47.12

## 2016-06-02 LAB — CBC
HEMATOCRIT: 38.5 % — AB (ref 39.0–52.0)
HEMOGLOBIN: 12 g/dL — AB (ref 13.0–17.0)
MCH: 25.2 pg — AB (ref 26.0–34.0)
MCHC: 31.2 g/dL (ref 30.0–36.0)
MCV: 80.7 fL (ref 78.0–100.0)
PLATELETS: 348 10*3/uL (ref 150–400)
RBC: 4.77 MIL/uL (ref 4.22–5.81)
RDW: 13 % (ref 11.5–15.5)
WBC: 9.5 10*3/uL (ref 4.0–10.5)

## 2016-06-02 LAB — BASIC METABOLIC PANEL
Anion gap: 8 (ref 5–15)
BUN: 9 mg/dL (ref 6–20)
CHLORIDE: 105 mmol/L (ref 101–111)
CO2: 25 mmol/L (ref 22–32)
Calcium: 9.1 mg/dL (ref 8.9–10.3)
Creatinine, Ser: 1.13 mg/dL (ref 0.61–1.24)
GFR calc Af Amer: >60 mL/min (ref >60)
GFR calc non Af Amer: >60 mL/min (ref >60)
GLUCOSE: 142 mg/dL — AB (ref 65–99)
POTASSIUM: 4.2 mmol/L (ref 3.5–5.1)
Sodium: 138 mmol/L (ref 135–145)

## 2016-06-02 LAB — SURGICAL PCR SCREEN
MRSA, PCR: NEGATIVE
Staphylococcus aureus: POSITIVE — AB

## 2016-06-02 SURGERY — ANTERIOR CERVICAL DECOMPRESSION/DISCECTOMY FUSION 2 LEVELS
Anesthesia: General

## 2016-06-02 MED ORDER — METHOCARBAMOL 750 MG PO TABS: 750.0000 mg | ORAL_TABLET | Freq: Four times a day (QID) | ORAL | Status: AC

## 2016-06-02 MED ORDER — DEXAMETHASONE SODIUM PHOSPHATE 10 MG/ML IJ SOLN
INTRAMUSCULAR | Status: DC | PRN
  Administered 2016-06-02: 10 mg via INTRAVENOUS

## 2016-06-02 MED ORDER — PHENOL 1.4 % MT LIQD
1.0000 | OROMUCOSAL | Status: DC | PRN
Start: 2016-06-02 — End: 2016-06-02

## 2016-06-02 MED ORDER — CEFAZOLIN SODIUM 1-5 GM-% IV SOLN
1.0000 g | Freq: Three times a day (TID) | INTRAVENOUS | Status: DC
  Administered 2016-06-02: 1 g via INTRAVENOUS
  Filled 2016-06-02: qty 50

## 2016-06-02 MED ORDER — ROCURONIUM BROMIDE 100 MG/10ML IV SOLN
INTRAVENOUS | Status: DC | PRN
  Administered 2016-06-02 (×2): 10 mg via INTRAVENOUS
  Administered 2016-06-02: 20 mg via INTRAVENOUS
  Administered 2016-06-02: 50 mg via INTRAVENOUS

## 2016-06-02 MED ORDER — LIDOCAINE HCL (CARDIAC) 20 MG/ML IV SOLN
INTRAVENOUS | Status: DC | PRN
  Administered 2016-06-02: 100 mg via INTRAVENOUS

## 2016-06-02 MED ORDER — HEMOSTATIC AGENTS (NO CHARGE) OPTIME
TOPICAL | Status: DC | PRN
  Administered 2016-06-02: 1 via TOPICAL

## 2016-06-02 MED ORDER — FENTANYL CITRATE (PF) 250 MCG/5ML IJ SOLN
INTRAMUSCULAR | Status: AC
  Filled 2016-06-02: qty 5

## 2016-06-02 MED ORDER — SUGAMMADEX SODIUM 200 MG/2ML IV SOLN
INTRAVENOUS | Status: DC | PRN
  Administered 2016-06-02: 200 mg via INTRAVENOUS

## 2016-06-02 MED ORDER — DOCUSATE SODIUM 100 MG PO CAPS: 100.0000 mg | ORAL_CAPSULE | Freq: Two times a day (BID) | ORAL | Status: AC

## 2016-06-02 MED ORDER — EPHEDRINE 5 MG/ML INJ
INTRAVENOUS | Status: AC
  Filled 2016-06-02: qty 10

## 2016-06-02 MED ORDER — MUPIROCIN 2 % EX OINT
1.0000 "application " | TOPICAL_OINTMENT | Freq: Once | CUTANEOUS | Status: AC
  Administered 2016-06-02: 1 via TOPICAL

## 2016-06-02 MED ORDER — DOCUSATE SODIUM 100 MG PO CAPS
100.0000 mg | ORAL_CAPSULE | Freq: Two times a day (BID) | ORAL | Status: DC
  Administered 2016-06-02: 100 mg via ORAL
  Filled 2016-06-02: qty 1

## 2016-06-02 MED ORDER — ACETAMINOPHEN 10 MG/ML IV SOLN
1000.0000 mg | Freq: Once | INTRAVENOUS | Status: AC
  Administered 2016-06-02: 1000 mg via INTRAVENOUS
  Filled 2016-06-02: qty 100

## 2016-06-02 MED ORDER — MUPIROCIN 2 % EX OINT
TOPICAL_OINTMENT | CUTANEOUS | Status: AC
  Filled 2016-06-02: qty 22

## 2016-06-02 MED ORDER — BISACODYL 10 MG RE SUPP: 10.0000 mg | Freq: Every day | RECTAL | Status: DC | PRN

## 2016-06-02 MED ORDER — ONDANSETRON HCL 4 MG/2ML IJ SOLN
INTRAMUSCULAR | Status: DC | PRN
  Administered 2016-06-02: 4 mg via INTRAVENOUS

## 2016-06-02 MED ORDER — METHOCARBAMOL 750 MG PO TABS
750.0000 mg | ORAL_TABLET | Freq: Four times a day (QID) | ORAL | Status: DC
  Administered 2016-06-02: 750 mg via ORAL
  Filled 2016-06-02: qty 1

## 2016-06-02 MED ORDER — SODIUM CHLORIDE 0.9% FLUSH: 3.0000 mL | INTRAVENOUS | Status: DC | PRN

## 2016-06-02 MED ORDER — OXYCODONE HCL ER 10 MG PO T12A
10.0000 mg | EXTENDED_RELEASE_TABLET | Freq: Once | ORAL | Status: AC
  Administered 2016-06-02: 10 mg via ORAL
  Filled 2016-06-02: qty 1

## 2016-06-02 MED ORDER — DEXAMETHASONE SODIUM PHOSPHATE 4 MG/ML IJ SOLN
2.0000 mg | Freq: Three times a day (TID) | INTRAMUSCULAR | Status: DC
  Administered 2016-06-02: 2 mg via INTRAVENOUS
  Filled 2016-06-02: qty 1

## 2016-06-02 MED ORDER — CEFAZOLIN (ANCEF) 1 G IV SOLR
2.0000 g | INTRAVENOUS | Status: DC
  Filled 2016-06-02: qty 2

## 2016-06-02 MED ORDER — MIDAZOLAM HCL 5 MG/5ML IJ SOLN
INTRAMUSCULAR | Status: DC | PRN
  Administered 2016-06-02: 2 mg via INTRAVENOUS

## 2016-06-02 MED ORDER — SODIUM CHLORIDE 0.9% FLUSH
3.0000 mL | Freq: Two times a day (BID) | INTRAVENOUS | Status: DC
  Administered 2016-06-02: 3 mL via INTRAVENOUS

## 2016-06-02 MED ORDER — CELECOXIB 200 MG PO CAPS
200.0000 mg | ORAL_CAPSULE | Freq: Once | ORAL | Status: AC
  Administered 2016-06-02: 200 mg via ORAL
  Filled 2016-06-02 (×2): qty 1

## 2016-06-02 MED ORDER — SODIUM CHLORIDE 0.9 % IV SOLN: INTRAVENOUS | Status: AC | PRN

## 2016-06-02 MED ORDER — PHENYLEPHRINE HCL 10 MG/ML IJ SOLN
10.0000 mg | INTRAVENOUS | Status: DC | PRN
  Administered 2016-06-02: 40 ug/min via INTRAVENOUS

## 2016-06-02 MED ORDER — ONDANSETRON HCL 4 MG/2ML IJ SOLN
4.0000 mg | INTRAMUSCULAR | Status: DC | PRN
Start: 2016-06-02 — End: 2016-06-02

## 2016-06-02 MED ORDER — CELECOXIB 200 MG PO CAPS
200.0000 mg | ORAL_CAPSULE | Freq: Two times a day (BID) | ORAL | Status: DC
  Administered 2016-06-02: 200 mg via ORAL
  Filled 2016-06-02: qty 1

## 2016-06-02 MED ORDER — FENTANYL CITRATE (PF) 100 MCG/2ML IJ SOLN: 25.0000 ug | INTRAMUSCULAR | Status: DC | PRN

## 2016-06-02 MED ORDER — PROMETHAZINE HCL 25 MG/ML IJ SOLN: 6.2500 mg | INTRAMUSCULAR | Status: DC | PRN

## 2016-06-02 MED ORDER — ONDANSETRON HCL 4 MG/2ML IJ SOLN
INTRAMUSCULAR | Status: AC
  Filled 2016-06-02: qty 2

## 2016-06-02 MED ORDER — OXYCODONE HCL 5 MG PO TABS: 5.0000 mg | ORAL_TABLET | ORAL | Status: DC | PRN

## 2016-06-02 MED ORDER — LIDOCAINE-EPINEPHRINE 2 %-1:100000 IJ SOLN
INTRAMUSCULAR | Status: DC | PRN
Start: 2016-06-02 — End: 2016-06-02
  Administered 2016-06-02: 10 mL

## 2016-06-02 MED ORDER — CEFAZOLIN SODIUM-DEXTROSE 2-4 GM/100ML-% IV SOLN
INTRAVENOUS | Status: AC
  Administered 2016-06-02: 2 g via INTRAVENOUS
  Filled 2016-06-02: qty 100

## 2016-06-02 MED ORDER — BUPIVACAINE-EPINEPHRINE (PF) 0.5% -1:200000 IJ SOLN
INTRAMUSCULAR | Status: DC | PRN
  Administered 2016-06-02: 10 mL

## 2016-06-02 MED ORDER — SODIUM CHLORIDE 0.9 % IV SOLN: 250.0000 mL | INTRAVENOUS | Status: DC

## 2016-06-02 MED ORDER — THROMBIN 5000 UNITS EX SOLR
CUTANEOUS | Status: DC | PRN
  Administered 2016-06-02 (×2): 5000 [IU] via TOPICAL

## 2016-06-02 MED ORDER — PHENYLEPHRINE HCL 10 MG/ML IJ SOLN: 10.0000 mg | INTRAVENOUS | Status: DC | PRN

## 2016-06-02 MED ORDER — SUCCINYLCHOLINE CHLORIDE 200 MG/10ML IV SOSY
PREFILLED_SYRINGE | INTRAVENOUS | Status: AC
  Filled 2016-06-02: qty 10

## 2016-06-02 MED ORDER — ZOLPIDEM TARTRATE 5 MG PO TABS: 5.0000 mg | ORAL_TABLET | Freq: Every evening | ORAL | Status: DC | PRN

## 2016-06-02 MED ORDER — OXYCODONE-ACETAMINOPHEN 5-325 MG PO TABS: 1.0000 | ORAL_TABLET | Freq: Four times a day (QID) | ORAL | Status: AC | PRN

## 2016-06-02 MED ORDER — GABAPENTIN 300 MG PO CAPS
300.0000 mg | ORAL_CAPSULE | Freq: Three times a day (TID) | ORAL | Status: DC
  Administered 2016-06-02: 300 mg via ORAL
  Filled 2016-06-02: qty 1

## 2016-06-02 MED ORDER — MIDAZOLAM HCL 2 MG/2ML IJ SOLN
INTRAMUSCULAR | Status: AC
  Filled 2016-06-02: qty 2

## 2016-06-02 MED ORDER — PROPOFOL 10 MG/ML IV BOLUS
INTRAVENOUS | Status: DC | PRN
  Administered 2016-06-02: 200 mg via INTRAVENOUS

## 2016-06-02 MED ORDER — ROCURONIUM BROMIDE 50 MG/5ML IV SOLN
INTRAVENOUS | Status: AC
  Filled 2016-06-02: qty 1

## 2016-06-02 MED ORDER — SODIUM CHLORIDE 0.9 % IV SOLN: INTRAVENOUS | Status: DC

## 2016-06-02 MED ORDER — MENTHOL 3 MG MT LOZG: 1.0000 | LOZENGE | OROMUCOSAL | Status: DC | PRN

## 2016-06-02 MED ORDER — GABAPENTIN 300 MG PO CAPS: 300.0000 mg | ORAL_CAPSULE | Freq: Three times a day (TID) | ORAL | Status: AC

## 2016-06-02 MED ORDER — OXYCODONE HCL ER 10 MG PO T12A
10.0000 mg | EXTENDED_RELEASE_TABLET | Freq: Two times a day (BID) | ORAL | Status: DC
  Administered 2016-06-02: 10 mg via ORAL
  Filled 2016-06-02: qty 1

## 2016-06-02 MED ORDER — 0.9 % SODIUM CHLORIDE (POUR BTL) OPTIME
TOPICAL | Status: DC | PRN
  Administered 2016-06-02: 1000 mL

## 2016-06-02 MED ORDER — PANTOPRAZOLE SODIUM 20 MG PO TBEC
20.0000 mg | DELAYED_RELEASE_TABLET | Freq: Every day | ORAL | Status: DC
Start: 2016-06-02 — End: 2016-06-02
  Filled 2016-06-02: qty 1

## 2016-06-02 MED ORDER — BACITRACIN 50000 UNITS IM SOLR
INTRAMUSCULAR | Status: DC | PRN
  Administered 2016-06-02: 09:00:00

## 2016-06-02 MED ORDER — FENTANYL CITRATE (PF) 100 MCG/2ML IJ SOLN
INTRAMUSCULAR | Status: DC | PRN
  Administered 2016-06-02: 50 ug via INTRAVENOUS
  Administered 2016-06-02: 200 ug via INTRAVENOUS

## 2016-06-02 MED ORDER — THROMBIN 5000 UNITS EX SOLR
OROMUCOSAL | Status: DC | PRN
  Administered 2016-06-02: 10:00:00 via TOPICAL

## 2016-06-02 MED ORDER — GABAPENTIN 300 MG PO CAPS
300.0000 mg | ORAL_CAPSULE | Freq: Once | ORAL | Status: AC
  Administered 2016-06-02: 300 mg via ORAL
  Filled 2016-06-02 (×2): qty 1

## 2016-06-02 MED ORDER — ACETAMINOPHEN 10 MG/ML IV SOLN
INTRAVENOUS | Status: AC
  Filled 2016-06-02: qty 100

## 2016-06-02 MED ORDER — SENNA 8.6 MG PO TABS
1.0000 | ORAL_TABLET | Freq: Two times a day (BID) | ORAL | Status: DC
  Administered 2016-06-02: 8.6 mg via ORAL
  Filled 2016-06-02: qty 1

## 2016-06-02 MED ORDER — FLEET ENEMA 7-19 GM/118ML RE ENEM
1.0000 | ENEMA | Freq: Once | RECTAL | Status: DC | PRN
Start: 2016-06-02 — End: 2016-06-02

## 2016-06-02 MED ORDER — ACETAMINOPHEN 500 MG PO TABS
1000.0000 mg | ORAL_TABLET | Freq: Four times a day (QID) | ORAL | Status: DC
  Administered 2016-06-02: 1000 mg via ORAL
  Filled 2016-06-02: qty 2

## 2016-06-02 MED ORDER — LACTATED RINGERS IV SOLN
INTRAVENOUS | Status: DC | PRN
  Administered 2016-06-02 (×2): via INTRAVENOUS

## 2016-06-02 MED ORDER — PROPOFOL 10 MG/ML IV BOLUS
INTRAVENOUS | Status: AC
  Filled 2016-06-02: qty 20

## 2016-06-02 SURGICAL SUPPLY — 65 items
ADH SKN CLS APL DERMABOND .7 (GAUZE/BANDAGES/DRESSINGS) ×1
ALLOGRAFT TRIAD LORDOTIC CC (Bone Implant) ×4 IMPLANT
APPLICATOR CHLORAPREP 3ML ORNG (MISCELLANEOUS) ×3 IMPLANT
BIT DRILL NEURO 2X3.1 SFT TUCH (MISCELLANEOUS) ×1 IMPLANT
BIT DRILL POWER (BIT) IMPLANT
BLADE SURG 11 STRL SS (BLADE) ×3 IMPLANT
BLADE ULTRA TIP 2M (BLADE) IMPLANT
BONE MATRIX OSTEOCEL PRO SM (Bone Implant) ×2 IMPLANT
BUR MATCHSTICK NEURO 3.0 LAGG (BURR) ×3 IMPLANT
CANISTER SUCT 3000ML PPV (MISCELLANEOUS) ×3 IMPLANT
DECANTER SPIKE VIAL GLASS SM (MISCELLANEOUS) ×3 IMPLANT
DERMABOND ADVANCED (GAUZE/BANDAGES/DRESSINGS) ×2
DERMABOND ADVANCED .7 DNX12 (GAUZE/BANDAGES/DRESSINGS) ×1 IMPLANT
DRAPE C-ARM 42X72 X-RAY (DRAPES) ×5 IMPLANT
DRAPE C-ARMOR (DRAPES) ×1 IMPLANT
DRAPE LAPAROTOMY 100X72 PEDS (DRAPES) ×3 IMPLANT
DRAPE MICROSCOPE LEICA (MISCELLANEOUS) ×2 IMPLANT
DRAPE POUCH INSTRU U-SHP 10X18 (DRAPES) ×3 IMPLANT
DRAPE PROXIMA HALF (DRAPES) ×4 IMPLANT
DRAPE SHEET LG 3/4 BI-LAMINATE (DRAPES) ×3 IMPLANT
DRILL BIT POWER (BIT) ×3
DRILL NEURO 2X3.1 SOFT TOUCH (MISCELLANEOUS) ×3
ELECT COATED BLADE 2.86 ST (ELECTRODE) ×3 IMPLANT
ELECT REM PT RETURN 9FT ADLT (ELECTROSURGICAL) ×3
ELECTRODE REM PT RTRN 9FT ADLT (ELECTROSURGICAL) ×1 IMPLANT
EVACUATOR 1/8 PVC DRAIN (DRAIN) ×2 IMPLANT
GAUZE SPONGE 4X4 12PLY STRL (GAUZE/BANDAGES/DRESSINGS) IMPLANT
GAUZE SPONGE 4X4 16PLY XRAY LF (GAUZE/BANDAGES/DRESSINGS) IMPLANT
GLOVE BIOGEL PI IND STRL 7.5 (GLOVE) ×1 IMPLANT
GLOVE BIOGEL PI INDICATOR 7.5 (GLOVE) ×2
GLOVE EXAM NITRILE LRG STRL (GLOVE) IMPLANT
GLOVE SS BIOGEL STRL SZ 7 (GLOVE) ×2 IMPLANT
GLOVE SUPERSENSE BIOGEL SZ 7 (GLOVE) ×4
GOWN STRL REUS W/ TWL LRG LVL3 (GOWN DISPOSABLE) ×1 IMPLANT
GOWN STRL REUS W/ TWL XL LVL3 (GOWN DISPOSABLE) ×1 IMPLANT
GOWN STRL REUS W/TWL LRG LVL3 (GOWN DISPOSABLE) ×3
GOWN STRL REUS W/TWL XL LVL3 (GOWN DISPOSABLE) ×3
HEMOSTAT POWDER KIT SURGIFOAM (HEMOSTASIS) ×3 IMPLANT
KIT BASIN OR (CUSTOM PROCEDURE TRAY) ×3 IMPLANT
KIT ROOM TURNOVER OR (KITS) ×3 IMPLANT
NDL HYPO 25X1 1.5 SAFETY (NEEDLE) ×1 IMPLANT
NDL SPNL 22GX3.5 QUINCKE BK (NEEDLE) ×1 IMPLANT
NEEDLE HYPO 25X1 1.5 SAFETY (NEEDLE) ×3 IMPLANT
NEEDLE SPNL 22GX3.5 QUINCKE BK (NEEDLE) ×3 IMPLANT
NS IRRIG 1000ML POUR BTL (IV SOLUTION) ×3 IMPLANT
PACK LAMINECTOMY NEURO (CUSTOM PROCEDURE TRAY) ×3 IMPLANT
PAD ARMBOARD 7.5X6 YLW CONV (MISCELLANEOUS) ×7 IMPLANT
PATTIES SURGICAL .5X1.5 (GAUZE/BANDAGES/DRESSINGS) ×3 IMPLANT
PIN DISTRACTION 14MM (PIN) ×4 IMPLANT
PLATE ARCHON 42MM 2LVL (Plate) ×2 IMPLANT
RUBBERBAND STERILE (MISCELLANEOUS) IMPLANT
SCREW ARCHON ST FIX 4.0X17MM (Screw) ×6 IMPLANT
SCREW ARCHON ST VAR 4.5X17MM (Screw) ×6 IMPLANT
SPONGE INTESTINAL PEANUT (DISPOSABLE) ×5 IMPLANT
SPONGE SURGIFOAM ABS GEL SZ50 (HEMOSTASIS) IMPLANT
STAPLER VISISTAT 35W (STAPLE) ×3 IMPLANT
STOCKINETTE 6  STRL (DRAPES) ×2
STOCKINETTE 6 STRL (DRAPES) ×1 IMPLANT
SUT VIC AB 3-0 SH 8-18 (SUTURE) ×3 IMPLANT
SUT VIC AB 4-0 PS2 27 (SUTURE) ×2 IMPLANT
TOWEL OR 17X24 6PK STRL BLUE (TOWEL DISPOSABLE) ×6 IMPLANT
TOWEL OR 17X26 10 PK STRL BLUE (TOWEL DISPOSABLE) ×3 IMPLANT
TUBE CONNECTING 12'X1/4 (SUCTIONS) ×1
TUBE CONNECTING 12X1/4 (SUCTIONS) ×2 IMPLANT
WATER STERILE IRR 1000ML POUR (IV SOLUTION) ×3 IMPLANT

## 2016-06-02 NOTE — Discharge Summary (Signed)
  Date of Admission: 06/02/2016  Date of Discharge: 06/02/2016  Admission Diagnosis: Cervical spondylosis with myelopathy  Discharge Diagnosis: Same  Procedure Performed: C4-5, C5-6 anterior discectomy with fusion and plate fixation  Attending: Tamala Fothergill, MD  Hospital Course:  The patient was admitted for the above listed operation and had an uncomplicated post-operative course.  They were discharged in stable condition.  Follow up: 3 weeks    Medication List    STOP taking these medications        ibuprofen 200 MG tablet  Commonly known as:  ADVIL,MOTRIN      TAKE these medications        docusate sodium 100 MG capsule  Commonly known as:  COLACE  Take 1 capsule (100 mg total) by mouth 2 (two) times daily.     gabapentin 300 MG capsule  Commonly known as:  NEURONTIN  Take 1 capsule (300 mg total) by mouth 3 (three) times daily.     GOODY HEADACHE PO  Take 1 packet by mouth daily as needed (headaches).     loratadine 10 MG tablet  Commonly known as:  CLARITIN  Take 10 mg by mouth daily as needed for allergies.     methocarbamol 750 MG tablet  Commonly known as:  ROBAXIN  Take 1 tablet (750 mg total) by mouth 4 (four) times daily.     multivitamin tablet  Take 1 tablet by mouth daily.     oxyCODONE-acetaminophen 5-325 MG tablet  Commonly known as:  ROXICET  Take 1-2 tablets by mouth every 6 (six) hours as needed for severe pain.

## 2016-06-02 NOTE — Progress Notes (Signed)
Patient is discharged from room 3C04 at this time. Alert and in stable condition. IV site d/c'd and instructions read to patient with understanding verbalized. Left unit via wheelchair with all belongings and wife at side.

## 2016-06-02 NOTE — Op Note (Signed)
06/02/2016  10:47 AM  PATIENT:  Paul Morgan  50 y.o. male  PRE-OPERATIVE DIAGNOSIS:  Cervical spondylosis with myelopathy, herniated nucleus pulposus C4-5, cervical stenosis C4-5, C5-6  POST-OPERATIVE DIAGNOSIS:  Same  PROCEDURE:  C4-C5 and C5-C6 anterior discectomy with fusion and plate fixation, using allograft spacers and morcellized allograft  SURGEON:  Aldean Ast, MD  ASSISTANTS: Consuella Lose, MD  ANESTHESIA:   General  DRAINS: Medium Hemovac   SPECIMEN:  None  INDICATION FOR PROCEDURE: 50 year old male with progressive cervical spondylotic myelopathy. I recommended the above listed operation. We had a long discussion about the risks and benefits. I explained to him that he may be transiently worse after the operation due to increased blood flow to his spinal cord. I expect this to improve if it does occur. Patient understood the risks, benefits, and alternatives and potential outcomes and wished to proceed.  PROCEDURE DETAILS: Patient was brought to the operating room placed under general endotracheal anesthesia. Patient was placed in the supine position on the operating room table. The neck was prepped with betadine and chloraprep and draped in a sterile fashion.   A transverse incision was made on the right side of the neck. Dissection was carried down thru the subcutaneous tissue and the platysma was elevated, opened vertically, and undermined with Metzenbaum scissors. Dissection was then carried out thru an avascular plane leaving the sternocleidomastoid, carotid artery, and jugular vein laterally and the trachea and esophagus medially. The ventral aspect of the vertebral column was identified and a localizing x-ray was taken. The C4-5 level was identified. The longus colli muscles were then elevated and the retractor was placed. The annulus was incised and the disc space entered. Discectomy was performed with micro-curettes and pituitary and kerrison rongeurs.  I then used the high-speed drill to drill the endplates down to the level of the posterior longitudinal ligament. The operating microscope was draped and brought into the field provided additional magnification, illumination and visualization. Utilizing microsurgical technique, discectomy was continued posteriorly thru the disc space. Posterior longitudinal ligament was opened with a nerve hook, and then removed along with disc herniation and osteophytes, decompressing the spinal canal and thecal sac. We then continued to remove osteophytic overgrowth and disc material decompressing the neural foramina and exiting nerve roots bilaterally. So by both visualization and palpation we felt we had an adequate decompression of the neural elements. We then measured the height of the intravertebral disc space and selected an allograft interbody cage packed with morcellized allograft. It was then gently positioned in the intravertebral disc space and countersunk.   The superior distraction pin was then removed and bone bleeding was stopped with bone wax. The distraction pin was inserted at the inferior level. The annulus was incised and the disc space entered. Discectomy was performed with micro-curettes and pituitary and kerrison rongeurs. I then used the high-speed drill to drill the endplates down to the level of the posterior longitudinal ligament. The operating microscope was returned to the field.  The discectomy was continued posteriorly thru the disc space. Posterior longitudinal ligament was opened with a nerve hook, and then removed along with disc herniation and osteophytes, decompressing the spinal canal and thecal sac. We then continued to remove osteophytic overgrowth and disc material decompressing the neural foramina and exiting nerve roots bilaterally. So by both visualization and palpation we felt we had an adequate decompression of the neural elements. We then measured the height of the intravertebral  disc space and selected a second  allograft interbody cage which was packed with morcellized allograft. It was then gently positioned in the intravertebral disc space and countersunk.   I then inserted a titanium plate and placed four variable angle screws into the two superior vertebral bodies and two fixed angle screws at the inferior level and locked them into position. The wound was irrigated with bacitracin solution, checked for hemostasis which was established and confirmed. Once meticulous hemostasis was achieved a medium hemovac drain was placed. The platysma was closed with interrupted 3-0 undyed Vicryl suture, the subcuticular layer was closed with running undyed Vicryl suture. The skin edges were approximated with dermabond. The drapes were removed. A sterile dressing was applied. The patient was then awakened from general anesthesia and transferred to the recovery room in stable condition. At the end of the procedure all sponge, needle and instrument counts were correct.   PATIENT DISPOSITION:  PACU - hemodynamically stable.   Delay start of Pharmacological VTE agent (>24hrs) due to surgical blood loss or risk of bleeding:  yes

## 2016-06-02 NOTE — Anesthesia Postprocedure Evaluation (Signed)
Anesthesia Post Note  Patient: Paul Morgan  Procedure(s) Performed: Procedure(s) (LRB): Cervical four - five Cervical five - six Anterior cervical decompression/diskectomy/fusion (N/A)  Patient location during evaluation: PACU Anesthesia Type: General Level of consciousness: awake and alert Pain management: pain level controlled Vital Signs Assessment: post-procedure vital signs reviewed and stable Respiratory status: spontaneous breathing, nonlabored ventilation, respiratory function stable and patient connected to nasal cannula oxygen Cardiovascular status: blood pressure returned to baseline and stable Postop Assessment: no signs of nausea or vomiting Anesthetic complications: no    Last Vitals:  Filed Vitals:   06/02/16 1153 06/02/16 1624  BP: 144/95 129/94  Pulse: 94 85  Temp: 36.7 C 36.5 C  Resp: 20 20    Last Pain:  Filed Vitals:   06/02/16 1720  PainSc: 3                  Catalina Gravel

## 2016-06-02 NOTE — Progress Notes (Signed)
No changes All questions answered Ready for OR 

## 2016-06-02 NOTE — Progress Notes (Addendum)
Arrive to the unit via stretcher; Pt A&O x4; IV intact and transfusing; anterior neck incision dermabond and approximated. No active bleeding, discharge or drainage noted; pt report slight numbness and tingling to LUE which he states is better comparing to before surgery. Pt due to void; ambulatory in room with no problem. VSS; pt neck brace off as pt only has the neck collar to shower that his wife brought from home. Pt family at bedside and call light within reach. Will closely monitor. Delia Heady RN

## 2016-06-02 NOTE — Anesthesia Procedure Notes (Signed)
Procedure Name: Intubation Date/Time: 06/02/2016 8:26 AM Performed by: Carney Living Pre-anesthesia Checklist: Patient identified, Emergency Drugs available, Suction available, Patient being monitored and Timeout performed Patient Re-evaluated:Patient Re-evaluated prior to inductionOxygen Delivery Method: Circle system utilized Preoxygenation: Pre-oxygenation with 100% oxygen Intubation Type: IV induction Ventilation: Mask ventilation without difficulty and Oral airway inserted - appropriate to patient size Laryngoscope Size: Glidescope Grade View: Grade I Tube type: Oral Tube size: 7.5 mm Number of attempts: 1 Airway Equipment and Method: Stylet Placement Confirmation: ETT inserted through vocal cords under direct vision,  positive ETCO2 and breath sounds checked- equal and bilateral Secured at: 22 cm Tube secured with: Tape Dental Injury: Teeth and Oropharynx as per pre-operative assessment  Comments: Neck remained neutral during induction and intubation. Pt easily intubated utilizing Glidescope as above

## 2016-06-02 NOTE — Evaluation (Signed)
Physical Therapy Evaluation and Discharge Patient Details Name: Paul Morgan MRN: [id number] DOB: Apr 24, 1966 Today's Date: 06/02/2016   History of Present Illness  Pt is a 50 y/o M w/ several weeks of burning dysesthesias in his hands and numbness in his forearms and hands as well as weakness in his grip.  Pt is now s/p C4-6 ACDF.  Pt's PMH includes anxiety.    Clinical Impression  Patient is s/p above surgery and is at mod I level of functioning.  No instability noted w/ transfers, ambulation, and stair training.  Provided pt w/ cervical precaution handout and reviewed w/ pt. Pt reports difficulty w/ functional activities related to weakness w/ his grip, will need OT evaluation prior to d/c, RN made aware and OT notified. No skilled PT needs identified.  PT is signing off.    Follow Up Recommendations No PT follow up;Supervision - Intermittent    Equipment Recommendations  None recommended by PT    Recommendations for Other Services OT consult     Precautions / Restrictions Precautions Precautions: Cervical Precaution Comments: Provided pt w/ and reviewed cervical precaution handout.  Required Braces or Orthoses:  (brace in room; pt was told by Dr he does not need to wear) Restrictions Weight Bearing Restrictions: No      Mobility  Bed Mobility Overal bed mobility: Needs Assistance Bed Mobility: Rolling;Sidelying to Sit;Sit to Sidelying Rolling: Supervision Sidelying to sit: Supervision     Sit to sidelying: Supervision General bed mobility comments: Cues for log roll technique to avoid cervical rotation.  No physical assist needed.  Transfers Overall transfer level: Independent Equipment used: None             General transfer comment: No instability noted  Ambulation/Gait Ambulation/Gait assistance: Independent Ambulation Distance (Feet): 400 Feet Assistive device: None Gait Pattern/deviations: Step-through pattern   Gait velocity interpretation: at or  above normal speed for age/gender General Gait Details: Guarded posture but no instability noted.  Cues to rotate body when turning to look out window rather than rotating neck.  Stairs Stairs: Yes Stairs assistance: Modified independent (Device/Increase time) Stair Management: One rail Left;Alternating pattern;Forwards Number of Stairs: 10 General stair comments: Uses Lt rail.  No assist needed.  Safe technique.  Wheelchair Mobility    Modified Rankin (Stroke Patients Only)       Balance Overall balance assessment: Independent                                           Pertinent Vitals/Pain Pain Assessment: No/denies pain    Home Living Family/patient expects to be discharged to:: Private residence Living Arrangements: Spouse/significant other Available Help at Discharge: Family;Available PRN/intermittently (Wife home w/ pt tomorrow then back to work) Type of Home: House Home Access: Stairs to enter Entrance Stairs-Rails: Horticulturist, commercial of Steps: 5 Home Layout: One level Home Equipment: None      Prior Function Level of Independence: Independent         Comments: Although pt reports difficulty w/ brushing teeth, tying his shoes, fastening his belt (due to weakness with his grip)     Hand Dominance   Dominant Hand: Right    Extremity/Trunk Assessment   Upper Extremity Assessment: Defer to OT evaluation           Lower Extremity Assessment: Overall WFL for tasks assessed      Cervical / Trunk  Assessment: Other exceptions  Communication   Communication: No difficulties  Cognition Arousal/Alertness: Awake/alert Behavior During Therapy: WFL for tasks assessed/performed Overall Cognitive Status: Within Functional Limits for tasks assessed                      General Comments General comments (skin integrity, edema, etc.): Notified OT of pt's UE functional deficits.    Exercises        Assessment/Plan     PT Assessment Patent does not need any further PT services  PT Diagnosis Acute pain   PT Problem List    PT Treatment Interventions     PT Goals (Current goals can be found in the Care Plan section) Acute Rehab PT Goals Patient Stated Goal: to go home PT Goal Formulation: All assessment and education complete, DC therapy    Frequency     Barriers to discharge        Co-evaluation               End of Session Equipment Utilized During Treatment: Gait belt Activity Tolerance: Patient tolerated treatment well Patient left: in bed;with call bell/phone within reach;with family/visitor present Nurse Communication: Mobility status;Other (comment) (pt will benefit from OT eval prior to d/c)         Time: [id number] PT Time Calculation (min) (ACUTE ONLY): 19 min   Charges:   PT Evaluation $PT Eval Low Complexity: 1 Procedure     PT G Codes:       Collie Siad PT, DPT  Pager: [phone] Phone: [phone] 06/02/2016, 3:55 PM

## 2016-06-02 NOTE — Progress Notes (Signed)
Occupational Therapy Evaluation Patient Details Name: Paul Morgan MRN: [id number] DOB: 1966-10-17 Today's Date: 06/02/2016    History of Present Illness Pt is a 50 y/o M w/ several weeks of burning dysesthesias in his hands and numbness in his forearms and hands as well as weakness in his grip.  Pt is now s/p C4-6 ACDF.  Pt's PMH includes anxiety.   Clinical Impression   Pt presents with apparent sensory deficits B hands in addition to decreased fine motor and in hand maniipulation skills. Completed education regarding compensatory techniques for ADL and fine motor/coordination and strengthening HEP. Pt reports pain has improved since surgery. Discussed need for pt to monitor progress and if after several weeks of completing his HEP he is still having difficulty, he needs to discuss his concerns with Dr.Ditty and follow up with outpatient OT at the neuro outpt center.     Follow Up Recommendations  Other (comment) (with Dr. Cyndy Freeze)    Equipment Recommendations  None recommended by OT    Recommendations for Other Services       Precautions / Restrictions Precautions Precautions: Cervical Precaution Comments: Provided pt w/ and reviewed cervical precaution handout.  Required Braces or Orthoses:  (brace in room; pt was told by Dr he does not need to wear) Restrictions Weight Bearing Restrictions: No      Mobility Bed Mobility Overal bed mobility: Modified Independent Bed Mobility: Rolling;Sidelying to Sit;Sit to Sidelying Rolling: Supervision Sidelying to sit: Supervision     Sit to sidelying: Supervision General bed mobility comments: Cues for log roll technique to avoid cervical rotation.  No physical assist needed.  Transfers Overall transfer level: Independent Equipment used: None             General transfer comment: No instability noted    Balance Overall balance assessment: No apparent balance deficits (not formally assessed)                                           ADL Overall ADL's : Needs assistance/impaired                                       General ADL Comments: Pt states he has difficulty with brushing his teeth, buckling his belt, etc. Pt educated on compensatory techniqeus for ADL Issued built up tubing for utensils and toothbrush.      Vision     Perception     Praxis      Pertinent Vitals/Pain Pain Assessment: 0-10 Pain Score: 3  Pain Location: B hands. L worse than R Pain Descriptors / Indicators: Burning;Pins and needles Pain Intervention(s): Limited activity within patient's tolerance     Hand Dominance Right   Extremity/Trunk Assessment Upper Extremity Assessment Upper Extremity Assessment: RUE deficits/detail;LUE deficits/detail RUE Deficits / Details: Strength @ 4/5 throughout. Sensory deficits associated greater with thumb, index and midlle fingers on both orsal and palmer surfaces. L hand worse than R.  RUE Sensation: decreased light touch;decreased proprioception RUE Coordination: decreased fine motor LUE Deficits / Details: see above LUE Sensation: decreased light touch;decreased proprioception LUE Coordination: decreased fine motor   Lower Extremity Assessment Lower Extremity Assessment: Overall WFL for tasks assessed   Cervical / Trunk Assessment Cervical / Trunk Assessment: Other exceptions Cervical / Trunk Exceptions: s/p ACDF   Communication  Communication Communication: No difficulties   Cognition Arousal/Alertness: Awake/alert Behavior During Therapy: WFL for tasks assessed/performed Overall Cognitive Status: Within Functional Limits for tasks assessed                     General Comments       Exercises Exercises: Hand exercises;Other exercises Other Exercises Other Exercises: fine motor/coordination exercises and activities Other Exercises: theraputty  - medium resistance strengtheing Other Exercises: squeeze ball strengthening,  coordination activities Other Exercises: tactile stimulation/desensitization activities Other Exercises: activities to address stereognosis   Shoulder Instructions      Home Living Family/patient expects to be discharged to:: Private residence Living Arrangements: Spouse/significant other Available Help at Discharge: Family;Available PRN/intermittently (Wife home w/ pt tomorrow then back to work) Type of Home: House Home Access: Stairs to enter Technical brewer of Steps: 5 Entrance Stairs-Rails: Left Home Layout: One level     Bathroom Shower/Tub: Tub/shower unit         Home Equipment: None          Prior Functioning/Environment Level of Independence: Independent        Comments: Although pt reports difficulty w/ brushing teeth, tying his shoes, fastening his belt (due to weakness with his grip) Pt is truck driver    OT Diagnosis: Generalized weakness;Acute pain   OT Problem List: Decreased strength;Decreased coordination;Impaired UE functional use;Pain   OT Treatment/Interventions:      OT Goals(Current goals can be found in the care plan section) Acute Rehab OT Goals Patient Stated Goal: to go home OT Goal Formulation: All assessment and education complete, DC therapy  OT Frequency:     Barriers to D/C:            Co-evaluation              End of Session Nurse Communication: Other (comment) (discussed need for possible follow up pending progerss)  Activity Tolerance: Patient tolerated treatment well Patient left: in bed;with call bell/phone within reach;with nursing/sitter in room   Time: [id number] OT Time Calculation (min): 28 min Charges:  OT General Charges $OT Visit: 1 Procedure OT Evaluation $OT Eval Low Complexity: 1 Procedure OT Treatments $Therapeutic Activity: 8-22 mins G-Codes: OT G-codes **NOT FOR INPATIENT CLASS** Functional Assessment Tool Used: clinical judgement Functional Limitation: Self care Self Care Current  Status [id number]): At least 1 percent but less than 20 percent impaired, limited or restricted Self Care Goal Status [id number]): At least 1 percent but less than 20 percent impaired, limited or restricted Self Care Discharge Status [phone]): At least 1 percent but less than 20 percent impaired, limited or restricted  Erryn Dickison,HILLARY 06/02/2016, 4:54 PM   [facility], OTR/L  [phone] 06/02/2016

## 2016-06-02 NOTE — Clinical Social Work Note (Signed)
Clinical Social Worker received referral for possible ST-SNF placement.  Chart reviewed.  PT/OT signing off.    CSW signing off - please re consult if social work needs arise.  Barbette Or, [place]

## 2016-06-02 NOTE — Transfer of Care (Signed)
Immediate Anesthesia Transfer of Care Note  Patient: Paul Morgan  Procedure(s) Performed: Procedure(s) with comments: Cervical four - five Cervical five - six Anterior cervical decompression/diskectomy/fusion (N/A) - C4-5 C5-6 Anterior cervical decompression/diskectomy/fusion  Patient Location: PACU  Anesthesia Type:General  Level of Consciousness: awake, alert , oriented and patient cooperative  Airway & Oxygen Therapy: Patient Spontanous Breathing and Patient connected to nasal cannula oxygen  Post-op Assessment: Report given to RN, Post -op Vital signs reviewed and stable and Patient moving all extremities X 4  Post vital signs: Reviewed and stable  Last Vitals:  Filed Vitals:   06/02/16 0657  BP: 148/101  Pulse: 95  Temp: 37.1 C  Resp: 18    Last Pain:  Filed Vitals:   06/02/16 0734  PainSc: 5       Patients Stated Pain Goal: 8 [phone]  Complications: No apparent anesthesia complications

## 2016-06-02 NOTE — Progress Notes (Signed)
Pt voided in BR; pt also ambulated 300 ft in hallways with no problem. Will continue to monitor closely. Delia Heady RN

## 2016-06-02 NOTE — H&P (Signed)
This 50 year old male presents for neck pain.  History of Present Illness: 1.  neck pain    Paul Morgan presents to neurosurgery clinic with several weeks of progressive burning dysesthesias in his hands as well as numbness in his forearms and hands.  He reports weakness in his grip.  He's been complaining of neck pain for a little more than a week.  He denies any recent injuries.  He denies any lower extremity problems.  He denies any bowel or bladder problems.  He is not taking any medications.  He reports that he is otherwise healthy.       PAST MEDICAL HISTORY, SURGICAL HISTORY, FAMILY HISTORY, SOCIAL HISTORY AND REVIEW OF SYSTEMS I have reviewed the patient's past medical, surgical, family and social history as well as the comprehensive review of systems as included on the [facility] history form dated 05/29/2016, which I have signed.   MEDICATIONS(added, continued or stopped this visit): None.   ALLERGIES: Ingredient Reaction Medication Name Comment  NO KNOWN ALLERGIES     No known allergies.    Vitals Date Temp F BP Pulse Ht In Wt Lb BMI BSA Pain Score  05/29/2016  128/89 105 69 218 32.19  7/10     PHYSICAL EXAM General Level of Distress: no acute distress Overall Appearance: normal  Head and Face  Right Left  Fundoscopic Exam:  normal normal    Cardiovascular Cardiac: regular rate and rhythm without murmur  Respiratory Lungs: normal  Neurological Orientation: normal Attention Span and Concentration:   normal  Musculoskeletal Gait and Station: normal  Right Left Upper Extremity Muscle Strength: normal normal Lower Extremity Muscle Strength: normal normal Upper Extremity Muscle Tone:  normal normal Lower Extremity Muscle Tone: normal normal  Motor Strength Upper and lower extremity motor strength was tested in the clinically pertinent muscles. Any abnormal findings will be noted below.   Right Left Grip: 4/5 4/5  Gaze  Normal Horizontal Gaze Stability: normal Horizontal Nystagmus:  present Vertical Gaze Stability:  normal Vertical Nystagmus:  present  Near Point Convergence: normal  Deep Tendon Reflexes  Right Left Biceps: increase increase Triceps: increase increase Brachiloradialis: increase increase Patellar: increase increase Achilles: increase increase  Sensory Sensation was tested at C2 to T1 and L1 to S1.   Cranial Nerves II. Optic Nerve/Visual Fields: normal III. Oculomotor: normal IV. Trochlear: normal V. Trigeminal: normal VI. Abducens: normal VII. Facial: normal VIII. Acoustic/Vestibular: normal IX. Glossopharyngeal: normal X. Vagus: normal XI. Spinal Accessory: normal XII. Hypoglossal: normal  Motor and other Tests    Right Left Hoffman's: present present Clonus: normal normal     DIAGNOSTIC RESULTS I then apparently reviewed and noncontrast MR study of the cervical spine.  He has straightening of his cervical spine.  He has normal alignment.  He has multilevel spondylosis.  He has degenerative disc disease at multiple levels.  He has disc bulges the borderline and disc herniations at C4-C5 and C5-C6.  He has myelomalacia at both of these levels.  Myelomalacia is worse at C5-C6, however his stenosis is worse at C4-C5.    IMPRESSION Paul Morgan is a 50 year old African American man with cervical spondylosis with myelopathy.  He has neurological deficits as described above.  I'm going to start him on Neurontin to treat his burning dysesthesias.  I have explained to him that he should start with one tablet a day for several days increase that to 2 tablets a day for several days and then 3  tablets a day.  I have also recommended C4 C5 and C5 C6 anterior discectomy with fusion and plate fixation.  We will plan to do this early next week.  Assessment/Plan # Detail Type Description   1. Assessment Cervical spondylosis with myelopathy (M47.12).         Pain  Assessment/Treatment Pain Scale: 7/10. Method: Numeric Pain Intensity Scale. Location: neck. Onset: 05/19/2016. Duration: varies. Quality: discomforting. Pain Assessment/Treatment follow-up plan of care: Patient will continue medication management..  C4 C5 and C5 C6 ACDF, start Neurontin

## 2016-06-03 ENCOUNTER — Encounter (HOSPITAL_COMMUNITY): Payer: Self-pay | Admitting: Neurological Surgery

## 2016-06-03 NOTE — Progress Notes (Signed)
New PT note to include G-codes.  Collie Siad PT, DPT  Pager: [phone] Phone: [phone]    06-10-16 1546  PT G-Codes **NOT FOR INPATIENT CLASS**  Functional Assessment Tool Used Clinical Judgement  Functional Limitation Mobility: Walking and moving around  Mobility: Walking and Moving Around Current Status [phone]) CI  Mobility: Walking and Moving Around Goal Status [phone]) CI  Mobility: Walking and Moving Around Discharge Status [phone]) CI

## 2017-01-20 DIAGNOSIS — R61 Generalized hyperhidrosis: Secondary | ICD-10-CM | POA: Diagnosis not present

## 2017-01-20 DIAGNOSIS — E1165 Type 2 diabetes mellitus with hyperglycemia: Secondary | ICD-10-CM | POA: Diagnosis not present

## 2017-01-20 DIAGNOSIS — L02412 Cutaneous abscess of left axilla: Secondary | ICD-10-CM | POA: Diagnosis not present

## 2017-01-20 DIAGNOSIS — Z8639 Personal history of other endocrine, nutritional and metabolic disease: Secondary | ICD-10-CM | POA: Diagnosis not present

## 2017-01-20 DIAGNOSIS — I10 Essential (primary) hypertension: Secondary | ICD-10-CM | POA: Diagnosis not present

## 2017-01-20 DIAGNOSIS — Z125 Encounter for screening for malignant neoplasm of prostate: Secondary | ICD-10-CM | POA: Diagnosis not present

## 2017-01-28 IMAGING — RF DG C-ARM 61-120 MIN
1 series · 1 of 1 positions shown · non-contrast
Comparison: MRI 05/28/2016

CLINICAL DATA: Cervical fusion.

EXAM:
DG C-ARM 61-120 MIN; DG CERVICAL SPINE - 1 VIEW

[Series 1: run · 1 of 1 slices shown]
[im 1/1]
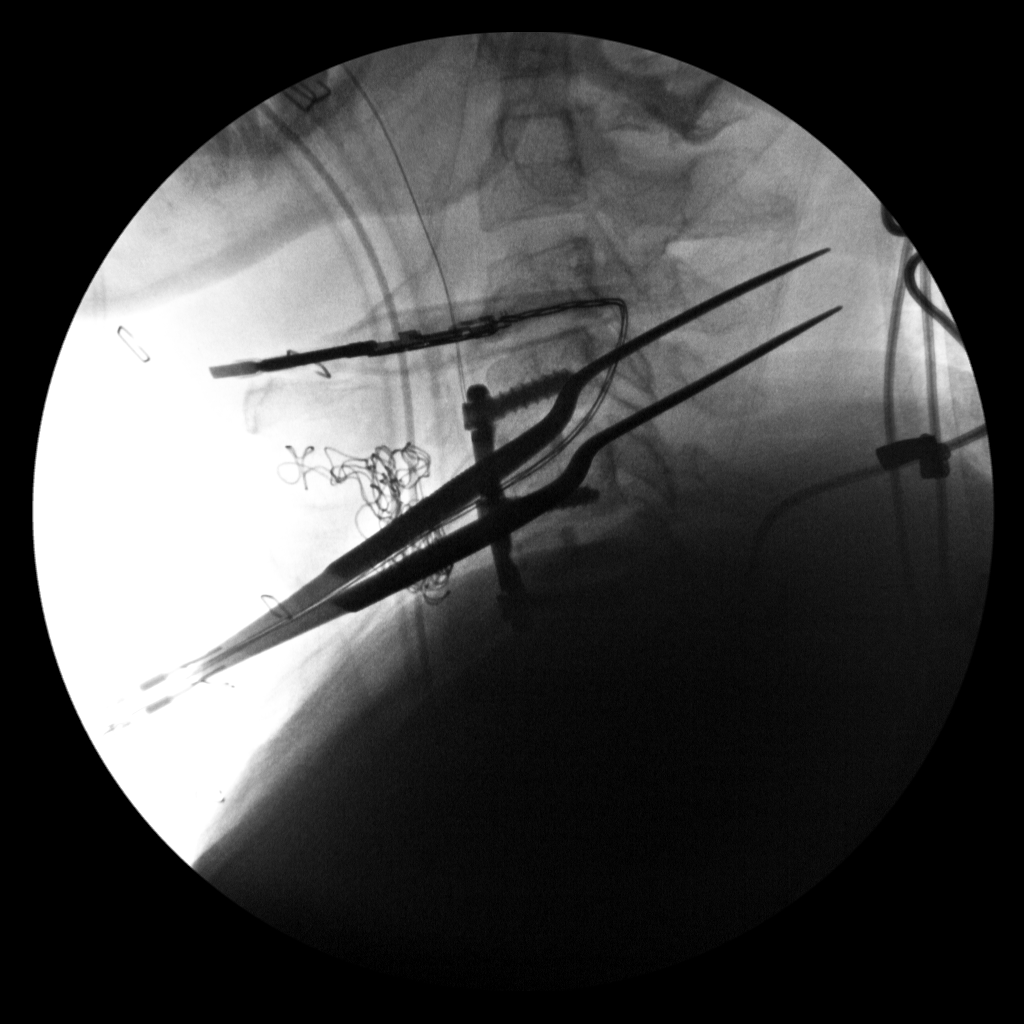

[1 of 1 positions shown; findings below may reference images not displayed]

FINDINGS: Single cross-table lateral spot image of the cervical spine
demonstrates changes of anterior fusion from C4-C6. The C6 vertebral
body is difficult to visualize due to overlying shoulders. C5
vertebral body is partially obscured by overlying surgical
instruments. There appears to be normal alignment with no visible
complicating feature.
IMPRESSION: ACDF C4-C6.

## 2017-02-17 DIAGNOSIS — L02412 Cutaneous abscess of left axilla: Secondary | ICD-10-CM | POA: Diagnosis not present

## 2017-02-17 DIAGNOSIS — E1165 Type 2 diabetes mellitus with hyperglycemia: Secondary | ICD-10-CM | POA: Diagnosis not present

## 2017-02-17 DIAGNOSIS — I10 Essential (primary) hypertension: Secondary | ICD-10-CM | POA: Diagnosis not present

## 2017-02-17 DIAGNOSIS — Z23 Encounter for immunization: Secondary | ICD-10-CM | POA: Diagnosis not present

## 2017-04-19 DIAGNOSIS — I10 Essential (primary) hypertension: Secondary | ICD-10-CM | POA: Diagnosis not present

## 2017-04-19 DIAGNOSIS — E1165 Type 2 diabetes mellitus with hyperglycemia: Secondary | ICD-10-CM | POA: Diagnosis not present

## 2017-04-19 DIAGNOSIS — M545 Low back pain: Secondary | ICD-10-CM | POA: Diagnosis not present

## 2017-04-19 DIAGNOSIS — L732 Hidradenitis suppurativa: Secondary | ICD-10-CM | POA: Diagnosis not present

## 2017-04-19 DIAGNOSIS — G8929 Other chronic pain: Secondary | ICD-10-CM | POA: Diagnosis not present

## 2017-05-05 DIAGNOSIS — A419 Sepsis, unspecified organism: Secondary | ICD-10-CM | POA: Diagnosis not present

## 2017-05-05 DIAGNOSIS — R61 Generalized hyperhidrosis: Secondary | ICD-10-CM | POA: Diagnosis not present

## 2017-05-05 DIAGNOSIS — Z7982 Long term (current) use of aspirin: Secondary | ICD-10-CM | POA: Diagnosis not present

## 2017-05-05 DIAGNOSIS — R200 Anesthesia of skin: Secondary | ICD-10-CM | POA: Diagnosis not present

## 2017-05-05 DIAGNOSIS — M5116 Intervertebral disc disorders with radiculopathy, lumbar region: Secondary | ICD-10-CM | POA: Diagnosis not present

## 2017-05-05 DIAGNOSIS — L732 Hidradenitis suppurativa: Secondary | ICD-10-CM | POA: Diagnosis not present

## 2017-05-05 DIAGNOSIS — E119 Type 2 diabetes mellitus without complications: Secondary | ICD-10-CM | POA: Diagnosis not present

## 2017-05-05 DIAGNOSIS — M545 Low back pain: Secondary | ICD-10-CM | POA: Diagnosis not present

## 2017-05-05 DIAGNOSIS — Z981 Arthrodesis status: Secondary | ICD-10-CM | POA: Diagnosis not present

## 2017-05-05 DIAGNOSIS — Z8249 Family history of ischemic heart disease and other diseases of the circulatory system: Secondary | ICD-10-CM | POA: Diagnosis not present

## 2017-05-05 DIAGNOSIS — G8929 Other chronic pain: Secondary | ICD-10-CM | POA: Diagnosis not present

## 2017-05-05 DIAGNOSIS — I10 Essential (primary) hypertension: Secondary | ICD-10-CM | POA: Diagnosis not present

## 2017-05-18 DIAGNOSIS — L732 Hidradenitis suppurativa: Secondary | ICD-10-CM | POA: Diagnosis not present

## 2017-05-18 DIAGNOSIS — M5126 Other intervertebral disc displacement, lumbar region: Secondary | ICD-10-CM | POA: Diagnosis not present

## 2017-05-18 DIAGNOSIS — M48061 Spinal stenosis, lumbar region without neurogenic claudication: Secondary | ICD-10-CM | POA: Diagnosis not present

## 2017-05-18 DIAGNOSIS — Z6833 Body mass index (BMI) 33.0-33.9, adult: Secondary | ICD-10-CM | POA: Diagnosis not present

## 2017-05-28 DIAGNOSIS — M545 Low back pain: Secondary | ICD-10-CM | POA: Diagnosis not present

## 2017-05-28 DIAGNOSIS — G8929 Other chronic pain: Secondary | ICD-10-CM | POA: Diagnosis not present

## 2017-05-28 DIAGNOSIS — E119 Type 2 diabetes mellitus without complications: Secondary | ICD-10-CM | POA: Diagnosis not present

## 2017-06-29 DIAGNOSIS — M545 Low back pain: Secondary | ICD-10-CM | POA: Diagnosis not present

## 2017-06-29 DIAGNOSIS — I10 Essential (primary) hypertension: Secondary | ICD-10-CM | POA: Diagnosis not present

## 2017-06-29 DIAGNOSIS — E119 Type 2 diabetes mellitus without complications: Secondary | ICD-10-CM | POA: Diagnosis not present

## 2017-06-29 DIAGNOSIS — L732 Hidradenitis suppurativa: Secondary | ICD-10-CM | POA: Diagnosis not present

## 2017-07-05 DIAGNOSIS — M48061 Spinal stenosis, lumbar region without neurogenic claudication: Secondary | ICD-10-CM | POA: Diagnosis not present

## 2017-07-05 DIAGNOSIS — Z6833 Body mass index (BMI) 33.0-33.9, adult: Secondary | ICD-10-CM | POA: Diagnosis not present

## 2017-07-05 DIAGNOSIS — M5126 Other intervertebral disc displacement, lumbar region: Secondary | ICD-10-CM | POA: Diagnosis not present

## 2017-07-16 DIAGNOSIS — E119 Type 2 diabetes mellitus without complications: Secondary | ICD-10-CM | POA: Diagnosis not present

## 2017-07-16 DIAGNOSIS — M48062 Spinal stenosis, lumbar region with neurogenic claudication: Secondary | ICD-10-CM | POA: Diagnosis not present

## 2017-07-16 DIAGNOSIS — I10 Essential (primary) hypertension: Secondary | ICD-10-CM | POA: Diagnosis not present

## 2017-07-16 DIAGNOSIS — Z01818 Encounter for other preprocedural examination: Secondary | ICD-10-CM | POA: Diagnosis not present

## 2017-07-23 DIAGNOSIS — M545 Low back pain: Secondary | ICD-10-CM | POA: Diagnosis not present

## 2017-07-23 DIAGNOSIS — Z01818 Encounter for other preprocedural examination: Secondary | ICD-10-CM | POA: Diagnosis not present

## 2017-07-23 DIAGNOSIS — I10 Essential (primary) hypertension: Secondary | ICD-10-CM | POA: Diagnosis not present

## 2017-07-23 DIAGNOSIS — Z87891 Personal history of nicotine dependence: Secondary | ICD-10-CM | POA: Diagnosis not present

## 2017-07-23 DIAGNOSIS — M48062 Spinal stenosis, lumbar region with neurogenic claudication: Secondary | ICD-10-CM | POA: Diagnosis not present

## 2017-07-23 DIAGNOSIS — G8929 Other chronic pain: Secondary | ICD-10-CM | POA: Diagnosis not present

## 2017-07-23 DIAGNOSIS — Z7982 Long term (current) use of aspirin: Secondary | ICD-10-CM | POA: Diagnosis not present

## 2017-07-23 DIAGNOSIS — M5126 Other intervertebral disc displacement, lumbar region: Secondary | ICD-10-CM | POA: Diagnosis not present

## 2017-07-23 DIAGNOSIS — R262 Difficulty in walking, not elsewhere classified: Secondary | ICD-10-CM | POA: Diagnosis not present

## 2017-07-23 DIAGNOSIS — Z79899 Other long term (current) drug therapy: Secondary | ICD-10-CM | POA: Diagnosis not present

## 2017-07-23 DIAGNOSIS — M4807 Spinal stenosis, lumbosacral region: Secondary | ICD-10-CM | POA: Diagnosis not present

## 2017-07-23 DIAGNOSIS — Z7984 Long term (current) use of oral hypoglycemic drugs: Secondary | ICD-10-CM | POA: Diagnosis not present

## 2017-07-23 DIAGNOSIS — M5127 Other intervertebral disc displacement, lumbosacral region: Secondary | ICD-10-CM | POA: Diagnosis not present

## 2017-07-23 DIAGNOSIS — E119 Type 2 diabetes mellitus without complications: Secondary | ICD-10-CM | POA: Diagnosis not present

## 2017-07-24 DIAGNOSIS — M4807 Spinal stenosis, lumbosacral region: Secondary | ICD-10-CM | POA: Diagnosis not present

## 2017-07-24 DIAGNOSIS — Z87891 Personal history of nicotine dependence: Secondary | ICD-10-CM | POA: Diagnosis not present

## 2017-07-24 DIAGNOSIS — Z7982 Long term (current) use of aspirin: Secondary | ICD-10-CM | POA: Diagnosis not present

## 2017-07-24 DIAGNOSIS — M5127 Other intervertebral disc displacement, lumbosacral region: Secondary | ICD-10-CM | POA: Diagnosis not present

## 2017-07-24 DIAGNOSIS — M5126 Other intervertebral disc displacement, lumbar region: Secondary | ICD-10-CM | POA: Diagnosis not present

## 2017-07-24 DIAGNOSIS — R262 Difficulty in walking, not elsewhere classified: Secondary | ICD-10-CM | POA: Diagnosis not present

## 2017-07-24 DIAGNOSIS — Z79899 Other long term (current) drug therapy: Secondary | ICD-10-CM | POA: Diagnosis not present

## 2017-07-24 DIAGNOSIS — M48062 Spinal stenosis, lumbar region with neurogenic claudication: Secondary | ICD-10-CM | POA: Diagnosis not present

## 2017-07-24 DIAGNOSIS — G8929 Other chronic pain: Secondary | ICD-10-CM | POA: Diagnosis not present

## 2017-07-24 DIAGNOSIS — E119 Type 2 diabetes mellitus without complications: Secondary | ICD-10-CM | POA: Diagnosis not present

## 2017-07-24 DIAGNOSIS — Z7984 Long term (current) use of oral hypoglycemic drugs: Secondary | ICD-10-CM | POA: Diagnosis not present

## 2017-07-24 DIAGNOSIS — I10 Essential (primary) hypertension: Secondary | ICD-10-CM | POA: Diagnosis not present

## 2017-07-25 DIAGNOSIS — M4807 Spinal stenosis, lumbosacral region: Secondary | ICD-10-CM | POA: Diagnosis not present

## 2017-07-25 DIAGNOSIS — R262 Difficulty in walking, not elsewhere classified: Secondary | ICD-10-CM | POA: Diagnosis not present

## 2017-07-25 DIAGNOSIS — Z79899 Other long term (current) drug therapy: Secondary | ICD-10-CM | POA: Diagnosis not present

## 2017-07-25 DIAGNOSIS — Z7984 Long term (current) use of oral hypoglycemic drugs: Secondary | ICD-10-CM | POA: Diagnosis not present

## 2017-07-25 DIAGNOSIS — E119 Type 2 diabetes mellitus without complications: Secondary | ICD-10-CM | POA: Diagnosis not present

## 2017-07-25 DIAGNOSIS — Z87891 Personal history of nicotine dependence: Secondary | ICD-10-CM | POA: Diagnosis not present

## 2017-07-25 DIAGNOSIS — M5126 Other intervertebral disc displacement, lumbar region: Secondary | ICD-10-CM | POA: Diagnosis not present

## 2017-07-25 DIAGNOSIS — I10 Essential (primary) hypertension: Secondary | ICD-10-CM | POA: Diagnosis not present

## 2017-07-25 DIAGNOSIS — Z7982 Long term (current) use of aspirin: Secondary | ICD-10-CM | POA: Diagnosis not present

## 2017-07-25 DIAGNOSIS — M48062 Spinal stenosis, lumbar region with neurogenic claudication: Secondary | ICD-10-CM | POA: Diagnosis not present

## 2017-07-25 DIAGNOSIS — M5127 Other intervertebral disc displacement, lumbosacral region: Secondary | ICD-10-CM | POA: Diagnosis not present

## 2017-07-25 DIAGNOSIS — G8929 Other chronic pain: Secondary | ICD-10-CM | POA: Diagnosis not present

## 2017-08-10 DIAGNOSIS — M48062 Spinal stenosis, lumbar region with neurogenic claudication: Secondary | ICD-10-CM | POA: Diagnosis not present

## 2017-08-10 DIAGNOSIS — I10 Essential (primary) hypertension: Secondary | ICD-10-CM | POA: Diagnosis not present

## 2017-08-10 DIAGNOSIS — E1165 Type 2 diabetes mellitus with hyperglycemia: Secondary | ICD-10-CM | POA: Diagnosis not present

## 2017-08-10 DIAGNOSIS — L732 Hidradenitis suppurativa: Secondary | ICD-10-CM | POA: Diagnosis not present

## 2018-11-15 ENCOUNTER — Encounter: Payer: Self-pay | Admitting: Emergency Medicine

## 2018-11-15 ENCOUNTER — Other Ambulatory Visit: Payer: Self-pay

## 2018-11-15 ENCOUNTER — Ambulatory Visit: Payer: BLUE CROSS/BLUE SHIELD | Admitting: Emergency Medicine

## 2018-11-15 VITALS — BP 150/80 | HR 102 | Temp 98.2°F | Resp 16 | Ht 68.5 in | Wt 228.3 lb

## 2018-11-15 DIAGNOSIS — R7303 Prediabetes: Secondary | ICD-10-CM | POA: Insufficient documentation

## 2018-11-15 DIAGNOSIS — I10 Essential (primary) hypertension: Secondary | ICD-10-CM | POA: Diagnosis not present

## 2018-11-15 LAB — POCT GLYCOSYLATED HEMOGLOBIN (HGB A1C): Hemoglobin A1C: 6.2 % — AB (ref 4.0–5.6)

## 2018-11-15 LAB — GLUCOSE, POCT (MANUAL RESULT ENTRY): POC Glucose: 193 mg/dl — AB (ref 70–99)

## 2018-11-15 MED ORDER — LISINOPRIL 10 MG PO TABS: 10.0000 mg | ORAL_TABLET | Freq: Every day | ORAL | 3 refills | Status: DC

## 2018-11-15 MED ORDER — METFORMIN HCL 500 MG PO TABS: 500.0000 mg | ORAL_TABLET | Freq: Two times a day (BID) | ORAL | 3 refills | Status: DC

## 2018-11-15 MED ORDER — AMLODIPINE BESYLATE 10 MG PO TABS: 10.0000 mg | ORAL_TABLET | Freq: Every day | ORAL | 3 refills | Status: DC

## 2018-11-15 NOTE — Patient Instructions (Addendum)
   If you have lab work done today you will be contacted with your lab results within the next 2 weeks.  If you have not heard from us then please contact us. The fastest way to get your results is to register for My Chart.   IF you received an x-ray today, you will receive an invoice from [facility]. Please contact [facility] at [phone] with questions or concerns regarding your invoice.   IF you received labwork today, you will receive an invoice from [facility]. Please contact [facility] at [phone] with questions or concerns regarding your invoice.   Our billing staff will not be able to assist you with questions regarding bills from these companies.  You will be contacted with the lab results as soon as they are available. The fastest way to get your results is to activate your My Chart account. Instructions are located on the last page of this paperwork. If you have not heard from us regarding the results in 2 weeks, please contact this office.     Diabetes Mellitus and Nutrition When you have diabetes (diabetes mellitus), it is very important to have healthy eating habits because your blood sugar (glucose) levels are greatly affected by what you eat and drink. Eating healthy foods in the appropriate amounts, at about the same times every day, can help you:  Control your blood glucose.  Lower your risk of heart disease.  Improve your blood pressure.  Reach or maintain a healthy weight.  Every person with diabetes is different, and each person has different needs for a meal plan. Your health care provider may recommend that you work with a diet and nutrition specialist (dietitian) to make a meal plan that is best for you. Your meal plan may vary depending on factors such as:  The calories you need.  The medicines you take.  Your weight.  Your blood glucose, blood pressure, and cholesterol levels.  Your activity level.  Other health conditions you  have, such as heart or kidney disease.  How do carbohydrates affect me? Carbohydrates affect your blood glucose level more than any other type of food. Eating carbohydrates naturally increases the amount of glucose in your blood. Carbohydrate counting is a method for keeping track of how many carbohydrates you eat. Counting carbohydrates is important to keep your blood glucose at a healthy level, especially if you use insulin or take certain oral diabetes medicines. It is important to know how many carbohydrates you can safely have in each meal. This is different for every person. Your dietitian can help you calculate how many carbohydrates you should have at each meal and for snack. Foods that contain carbohydrates include:  Bread, cereal, rice, pasta, and crackers.  Potatoes and corn.  Peas, beans, and lentils.  Milk and yogurt.  Fruit and juice.  Desserts, such as cakes, cookies, ice cream, and candy.  How does alcohol affect me? Alcohol can cause a sudden decrease in blood glucose (hypoglycemia), especially if you use insulin or take certain oral diabetes medicines. Hypoglycemia can be a life-threatening condition. Symptoms of hypoglycemia (sleepiness, dizziness, and confusion) are similar to symptoms of having too much alcohol. If your health care provider says that alcohol is safe for you, follow these guidelines:  Limit alcohol intake to no more than 1 drink per day for nonpregnant women and 2 drinks per day for men. One drink equals 12 oz of beer, 5 oz of wine, or 1 oz of hard liquor.  Do   not drink on an empty stomach.  Keep yourself hydrated with water, diet soda, or unsweetened iced tea.  Keep in mind that regular soda, juice, and other mixers may contain a lot of sugar and must be counted as carbohydrates.  What are tips for following this plan? Reading food labels  Start by checking the serving size on the label. The amount of calories, carbohydrates, fats, and other  nutrients listed on the label are based on one serving of the food. Many foods contain more than one serving per package.  Check the total grams (g) of carbohydrates in one serving. You can calculate the number of servings of carbohydrates in one serving by dividing the total carbohydrates by 15. For example, if a food has 30 g of total carbohydrates, it would be equal to 2 servings of carbohydrates.  Check the number of grams (g) of saturated and trans fats in one serving. Choose foods that have low or no amount of these fats.  Check the number of milligrams (mg) of sodium in one serving. Most people should limit total sodium intake to less than 2,300 mg per day.  Always check the nutrition information of foods labeled as "low-fat" or "nonfat". These foods may be higher in added sugar or refined carbohydrates and should be avoided.  Talk to your dietitian to identify your daily goals for nutrients listed on the label. Shopping  Avoid buying canned, premade, or processed foods. These foods tend to be high in fat, sodium, and added sugar.  Shop around the outside edge of the grocery store. This includes fresh fruits and vegetables, bulk grains, fresh meats, and fresh dairy. Cooking  Use low-heat cooking methods, such as baking, instead of high-heat cooking methods like deep frying.  Cook using healthy oils, such as olive, canola, or sunflower oil.  Avoid cooking with butter, cream, or high-fat meats. Meal planning  Eat meals and snacks regularly, preferably at the same times every day. Avoid going long periods of time without eating.  Eat foods high in fiber, such as fresh fruits, vegetables, beans, and whole grains. Talk to your dietitian about how many servings of carbohydrates you can eat at each meal.  Eat 4-6 ounces of lean protein each day, such as lean meat, chicken, fish, eggs, or tofu. 1 ounce is equal to 1 ounce of meat, chicken, or fish, 1 egg, or 1/4 cup of tofu.  Eat some  foods each day that contain healthy fats, such as avocado, nuts, seeds, and fish. Lifestyle   Check your blood glucose regularly.  Exercise at least 30 minutes 5 or more days each week, or as told by your health care provider.  Take medicines as told by your health care provider.  Do not use any products that contain nicotine or tobacco, such as cigarettes and e-cigarettes. If you need help quitting, ask your health care provider.  Work with a counselor or diabetes educator to identify strategies to manage stress and any emotional and social challenges. What are some questions to ask my health care provider?  Do I need to meet with a diabetes educator?  Do I need to meet with a dietitian?  What number can I call if I have questions?  When are the best times to check my blood glucose? Where to find more information:  American Diabetes Association: diabetes.org/food-and-fitness/food  Academy of Nutrition and Dietetics: www.eatright.org/resources/health/diseases-and-conditions/diabetes  National Institute of Diabetes and Digestive and Kidney Diseases (NIH): www.niddk.nih.gov/health-information/diabetes/overview/diet-eating-physical-activity Summary  A healthy meal plan will   help you control your blood glucose and maintain a healthy lifestyle.  Working with a diet and nutrition specialist (dietitian) can help you make a meal plan that is best for you.  Keep in mind that carbohydrates and alcohol have immediate effects on your blood glucose levels. It is important to count carbohydrates and to use alcohol carefully. This information is not intended to replace advice given to you by your health care provider. Make sure you discuss any questions you have with your health care provider. Document Released: 08/27/2005 Document Revised: 01/04/2017 Document Reviewed: 01/04/2017 Elsevier Interactive Patient Education  2018 [employer]  Hypertension Hypertension, commonly called high  blood pressure, is when the force of blood pumping through the arteries is too strong. The arteries are the blood vessels that carry blood from the heart throughout the body. Hypertension forces the heart to work harder to pump blood and may cause arteries to become narrow or stiff. Having untreated or uncontrolled hypertension can cause heart attacks, strokes, kidney disease, and other problems. A blood pressure reading consists of a higher number over a lower number. Ideally, your blood pressure should be below 120/80. The first ("top") number is called the systolic pressure. It is a measure of the pressure in your arteries as your heart beats. The second ("bottom") number is called the diastolic pressure. It is a measure of the pressure in your arteries as the heart relaxes. What are the causes? The cause of this condition is not known. What increases the risk? Some risk factors for high blood pressure are under your control. Others are not. Factors you can change  Smoking.  Having type 2 diabetes mellitus, high cholesterol, or both.  Not getting enough exercise or physical activity.  Being overweight.  Having too much fat, sugar, calories, or salt (sodium) in your diet.  Drinking too much alcohol. Factors that are difficult or impossible to change  Having chronic kidney disease.  Having a family history of high blood pressure.  Age. Risk increases with age.  Race. You may be at higher risk if you are African-American.  Gender. Men are at higher risk than women before age 45. After age 65, women are at higher risk than men.  Having obstructive sleep apnea.  Stress. What are the signs or symptoms? Extremely high blood pressure (hypertensive crisis) may cause:  Headache.  Anxiety.  Shortness of breath.  Nosebleed.  Nausea and vomiting.  Severe chest pain.  Jerky movements you cannot control (seizures).  How is this diagnosed? This condition is diagnosed by  measuring your blood pressure while you are seated, with your arm resting on a surface. The cuff of the blood pressure monitor will be placed directly against the skin of your upper arm at the level of your heart. It should be measured at least twice using the same arm. Certain conditions can cause a difference in blood pressure between your right and left arms. Certain factors can cause blood pressure readings to be lower or higher than normal (elevated) for a short period of time:  When your blood pressure is higher when you are in a health care provider's office than when you are at home, this is called white coat hypertension. Most people with this condition do not need medicines.  When your blood pressure is higher at home than when you are in a health care provider's office, this is called masked hypertension. Most people with this condition may need medicines to control blood pressure.  If you have   a high blood pressure reading during one visit or you have normal blood pressure with other risk factors:  You may be asked to return on a different day to have your blood pressure checked again.  You may be asked to monitor your blood pressure at home for 1 week or longer.  If you are diagnosed with hypertension, you may have other blood or imaging tests to help your health care provider understand your overall risk for other conditions. How is this treated? This condition is treated by making healthy lifestyle changes, such as eating healthy foods, exercising more, and reducing your alcohol intake. Your health care provider may prescribe medicine if lifestyle changes are not enough to get your blood pressure under control, and if:  Your systolic blood pressure is above 130.  Your diastolic blood pressure is above 80.  Your personal target blood pressure may vary depending on your medical conditions, your age, and other factors. Follow these instructions at home: Eating and drinking  Eat a  diet that is high in fiber and potassium, and low in sodium, added sugar, and fat. An example eating plan is called the DASH (Dietary Approaches to Stop Hypertension) diet. To eat this way: ? Eat plenty of fresh fruits and vegetables. Try to fill half of your plate at each meal with fruits and vegetables. ? Eat whole grains, such as whole wheat pasta, brown rice, or whole grain bread. Fill about one quarter of your plate with whole grains. ? Eat or drink low-fat dairy products, such as skim milk or low-fat yogurt. ? Avoid fatty cuts of meat, processed or cured meats, and poultry with skin. Fill about one quarter of your plate with lean proteins, such as fish, chicken without skin, beans, eggs, and tofu. ? Avoid premade and processed foods. These tend to be higher in sodium, added sugar, and fat.  Reduce your daily sodium intake. Most people with hypertension should eat less than 1,500 mg of sodium a day.  Limit alcohol intake to no more than 1 drink a day for nonpregnant women and 2 drinks a day for men. One drink equals 12 oz of beer, 5 oz of wine, or 1 oz of hard liquor. Lifestyle  Work with your health care provider to maintain a healthy body weight or to lose weight. Ask what an ideal weight is for you.  Get at least 30 minutes of exercise that causes your heart to beat faster (aerobic exercise) most days of the week. Activities may include walking, swimming, or biking.  Include exercise to strengthen your muscles (resistance exercise), such as pilates or lifting weights, as part of your weekly exercise routine. Try to do these types of exercises for 30 minutes at least 3 days a week.  Do not use any products that contain nicotine or tobacco, such as cigarettes and e-cigarettes. If you need help quitting, ask your health care provider.  Monitor your blood pressure at home as told by your health care provider.  Keep all follow-up visits as told by your health care provider. This is  important. Medicines  Take over-the-counter and prescription medicines only as told by your health care provider. Follow directions carefully. Blood pressure medicines must be taken as prescribed.  Do not skip doses of blood pressure medicine. Doing this puts you at risk for problems and can make the medicine less effective.  Ask your health care provider about side effects or reactions to medicines that you should watch for. Contact a health care   provider if:  You think you are having a reaction to a medicine you are taking.  You have headaches that keep coming back (recurring).  You feel dizzy.  You have swelling in your ankles.  You have trouble with your vision. Get help right away if:  You develop a severe headache or confusion.  You have unusual weakness or numbness.  You feel faint.  You have severe pain in your chest or abdomen.  You vomit repeatedly.  You have trouble breathing. Summary  Hypertension is when the force of blood pumping through your arteries is too strong. If this condition is not controlled, it may put you at risk for serious complications.  Your personal target blood pressure may vary depending on your medical conditions, your age, and other factors. For most people, a normal blood pressure is less than 120/80.  Hypertension is treated with lifestyle changes, medicines, or a combination of both. Lifestyle changes include weight loss, eating a healthy, low-sodium diet, exercising more, and limiting alcohol. This information is not intended to replace advice given to you by your health care provider. Make sure you discuss any questions you have with your health care provider. Document Released: 11/30/2005 Document Revised: 10/28/2016 Document Reviewed: 10/28/2016 Elsevier Interactive Patient Education  2018 [employer]  

## 2018-11-15 NOTE — Progress Notes (Signed)
Paul Morgan 52 y.o.   Chief Complaint  Patient presents with  . Establish Care    medication refills-Metformin, Lisinopril and Amlodpine  . PREDIABETES    and HTN per patient since spine surgery 07/2018   BP Readings from Last 3 Encounters:  11/15/18 (!) 150/80  06/02/16 (!) 129/94  05/28/16 142/96    HISTORY OF PRESENT ILLNESS: This is a 52 y.o. male here to establish care, first visit with me.  Has a history of prediabetes and hypertension, needs medication refills.  Taking amlodipine 10 mg a day, lisinopril 10 mg a day, metformin 500 mg twice a day.  Also has a history of chronic low back pain, status post surgery in the past.  HPI   Prior to Admission medications   Medication Sig Start Date End Date Taking? Authorizing Provider  Aspirin-Acetaminophen-Caffeine (GOODY HEADACHE PO) Take 1 packet by mouth daily as needed (headaches).   Yes [provider]  metFORMIN (GLUCOPHAGE) 1000 MG tablet Take 500 mg by mouth 2 (two) times daily with a meal.   Yes [provider]  Multiple Vitamin (MULTIVITAMIN) tablet Take 1 tablet by mouth daily.   Yes [provider]  docusate sodium (COLACE) 100 MG capsule Take 1 capsule (100 mg total) by mouth 2 (two) times daily. Patient not taking: Reported on 11/15/2018 06/02/16   Ditty, Kevan Ny, MD  gabapentin (NEURONTIN) 300 MG capsule Take 1 capsule (300 mg total) by mouth 3 (three) times daily. Patient not taking: Reported on 11/15/2018 06/02/16   Ditty, Kevan Ny, MD  loratadine (CLARITIN) 10 MG tablet Take 10 mg by mouth daily as needed for allergies.     [provider]  methocarbamol (ROBAXIN) 750 MG tablet Take 1 tablet (750 mg total) by mouth 4 (four) times daily. Patient not taking: Reported on 11/15/2018 06/02/16   Ditty, Kevan Ny, MD  oxyCODONE-acetaminophen (ROXICET) 5-325 MG tablet Take 1-2 tablets by mouth every 6 (six) hours as needed for severe pain. Patient not taking: Reported on  11/15/2018 06/02/16   Ditty, Kevan Ny, MD    No Known Allergies  Patient Active Problem List   Diagnosis Date Noted  . Cervical spondylosis with myelopathy 06/02/2016    Past Medical History:  Diagnosis Date  . Anxiety   . Cervical spondylosis with myelopathy   . Family history of adverse reaction to anesthesia    " My dad was a little nauseous after he woke up."  . Hydrocele 02/2010  . Pneumonia    hx of as a child     Past Surgical History:  Procedure Laterality Date  . ANTERIOR CERVICAL DECOMP/DISCECTOMY FUSION N/A 06/02/2016   Procedure: Cervical four - five Cervical five - six Anterior cervical decompression/diskectomy/fusion;  Surgeon: Kevan Ny Ditty, MD;  Location: [facility];  Service: Neurosurgery;  Laterality: N/A;  C4-5 C5-6 Anterior cervical decompression/diskectomy/fusion  . HYDROCELE EXCISION Bilateral 09/12/2014   Procedure: BILATERAL HYDROCELECTOMY ADULT;  Surgeon: Ardis Hughs, MD;  Location: [facility];  Service: Urology;  Laterality: Bilateral;    Social History   Socioeconomic History  . Marital status: Married    Spouse name: Not on file  . Number of children: Not on file  . Years of education: Not on file  . Highest education level: Not on file  Occupational History  . Not on file  Social Needs  . Financial resource strain: Not on file  . Food insecurity:    Worry: Not on file    Inability:  Not on file  . Transportation needs:    Medical: Not on file    Non-medical: Not on file  Tobacco Use  . Smoking status: Current Every Day Smoker    Packs/day: 1.00    Years: 16.00    Pack years: 16.00    Types: Cigarettes    Start date: 03/30/1997  . Smokeless tobacco: Never Used  Substance and Sexual Activity  . Alcohol use: Yes    Comment: social  . Drug use: No  . Sexual activity: Not on file  Lifestyle  . Physical activity:    Days per week: Not on file    Minutes per session: Not on file  . Stress: Not on file  Relationships  .  Social connections:    Talks on phone: Not on file    Gets together: Not on file    Attends religious service: Not on file    Active member of club or organization: Not on file    Attends meetings of clubs or organizations: Not on file    Relationship status: Not on file  . Intimate partner violence:    Fear of current or ex partner: Not on file    Emotionally abused: Not on file    Physically abused: Not on file    Forced sexual activity: Not on file  Other Topics Concern  . Not on file  Social History Narrative  . Not on file    Family History  Problem Relation Age of Onset  . Diabetes Mother   . COPD Mother   . Cancer Father   . Heart disease Sister   . Drug abuse Brother   . Diabetes Brother   . COPD Brother   . Cancer Sister   . Diabetes Brother      Review of Systems  Constitutional: Negative.  Negative for chills, fever and malaise/fatigue.  HENT: Negative.  Negative for congestion, hearing loss, nosebleeds and sore throat.   Eyes: Negative.  Negative for blurred vision and double vision.  Respiratory: Negative.  Negative for cough, hemoptysis and wheezing.   Cardiovascular: Negative.  Negative for chest pain and palpitations.  Gastrointestinal: Negative for abdominal pain, diarrhea, nausea and vomiting.  Genitourinary: Negative.  Negative for dysuria and hematuria.  Musculoskeletal: Negative.  Negative for back pain, myalgias and neck pain.  Skin: Negative.  Negative for rash.  Neurological: Negative.  Negative for dizziness and headaches.  Endo/Heme/Allergies: Negative.   All other systems reviewed and are negative.    Vitals:   11/15/18 1349  BP: (!) 150/80  Pulse: (!) 102  Resp: 16  Temp: 98.2 F (36.8 C)  SpO2: 98%     Physical Exam  Constitutional: He is oriented to person, place, and time. He appears well-developed and well-nourished.  HENT:  Head: Normocephalic and atraumatic.  Nose: Nose normal.  Mouth/Throat: Oropharynx is clear and  moist.  Eyes: Pupils are equal, round, and reactive to light. Conjunctivae and EOM are normal.  Neck: Normal range of motion. Neck supple.  Cardiovascular: Normal rate, regular rhythm, normal heart sounds and intact distal pulses.  Pulmonary/Chest: Effort normal and breath sounds normal.  Abdominal: Soft. He exhibits no distension. There is no tenderness.  Musculoskeletal: Normal range of motion.  Neurological: He is alert and oriented to person, place, and time. No sensory deficit. He exhibits normal muscle tone. Coordination normal.  Vitals reviewed.  Results for orders placed or performed in visit on 11/15/18 (from the past 24 hour(s))  POCT glucose (  manual entry)     Status: Abnormal   Collection Time: 11/15/18  3:09 PM  Result Value Ref Range   POC Glucose 193 (A) 70 - 99 mg/dl  POCT glycosylated hemoglobin (Hb A1C)     Status: Abnormal   Collection Time: 11/15/18  3:16 PM  Result Value Ref Range   Hemoglobin A1C 6.2 (A) 4.0 - 5.6 %   HbA1c POC (<> result, manual entry)     HbA1c, POC (prediabetic range)     HbA1c, POC (controlled diabetic range)      A total of 45 minutes was spent in the room with the patient, greater than 50% of which was in counseling/coordination of care regarding chronic medical conditions, medications, treatment, nutrition, need to increase physical activity, and need for follow-up.  ASSESSMENT & PLAN: Herron was seen today for establish care and prediabetes.  Diagnoses and all orders for this visit:  Essential hypertension -     CBC with Differential/Platelet -     Comprehensive metabolic panel -     lisinopril (PRINIVIL,ZESTRIL) 10 MG tablet; Take 1 tablet (10 mg total) by mouth daily. -     amLODipine (NORVASC) 10 MG tablet; Take 1 tablet (10 mg total) by mouth daily.  Prediabetes -     POCT glucose (manual entry) -     POCT glycosylated hemoglobin (Hb A1C) -     metFORMIN (GLUCOPHAGE) 500 MG tablet; Take 1 tablet (500 mg total) by mouth 2  (two) times daily with a meal.    Patient Instructions       If you have lab work done today you will be contacted with your lab results within the next 2 weeks.  If you have not heard from [place] then please contact us. The fastest way to get your results is to register for My Chart.   IF you received an x-ray today, you will receive an invoice from [facility]. Please contact [facility] at [phone] with questions or concerns regarding your invoice.   IF you received labwork today, you will receive an invoice from [facility]. Please contact [facility] at [phone] with questions or concerns regarding your invoice.   Our billing staff will not be able to assist you with questions regarding bills from these companies.  You will be contacted with the lab results as soon as they are available. The fastest way to get your results is to activate your My Chart account. Instructions are located on the last page of this paperwork. If you have not heard from [place] regarding the results in 2 weeks, please contact this office.     Diabetes Mellitus and Nutrition When you have diabetes (diabetes mellitus), it is very important to have healthy eating habits because your blood sugar (glucose) levels are greatly affected by what you eat and drink. Eating healthy foods in the appropriate amounts, at about the same times every day, can help you:  Control your blood glucose.  Lower your risk of heart disease.  Improve your blood pressure.  Reach or maintain a healthy weight.  Every person with diabetes is different, and each person has different needs for a meal plan. Your health care provider may recommend that you work with a diet and nutrition specialist (dietitian) to make a meal plan that is best for you. Your meal plan may vary depending on factors such as:  The calories you need.  The medicines you take.  Your weight.  Your blood glucose, blood pressure, and cholesterol  levels.  Your activity level.  Other health conditions you have, such as heart or kidney disease.  How do carbohydrates affect me? Carbohydrates affect your blood glucose level more than any other type of food. Eating carbohydrates naturally increases the amount of glucose in your blood. Carbohydrate counting is a method for keeping track of how many carbohydrates you eat. Counting carbohydrates is important to keep your blood glucose at a healthy level, especially if you use insulin or take certain oral diabetes medicines. It is important to know how many carbohydrates you can safely have in each meal. This is different for every person. Your dietitian can help you calculate how many carbohydrates you should have at each meal and for snack. Foods that contain carbohydrates include:  Bread, cereal, rice, pasta, and crackers.  Potatoes and corn.  Peas, beans, and lentils.  Milk and yogurt.  Fruit and juice.  Desserts, such as cakes, cookies, ice cream, and candy.  How does alcohol affect me? Alcohol can cause a sudden decrease in blood glucose (hypoglycemia), especially if you use insulin or take certain oral diabetes medicines. Hypoglycemia can be a life-threatening condition. Symptoms of hypoglycemia (sleepiness, dizziness, and confusion) are similar to symptoms of having too much alcohol. If your health care provider says that alcohol is safe for you, follow these guidelines:  Limit alcohol intake to no more than 1 drink per day for nonpregnant women and 2 drinks per day for men. One drink equals 12 oz of beer, 5 oz of wine, or 1 oz of hard liquor.  Do not drink on an empty stomach.  Keep yourself hydrated with water, diet soda, or unsweetened iced tea.  Keep in mind that regular soda, juice, and other mixers may contain a lot of sugar and must be counted as carbohydrates.  What are tips for following this plan? Reading food labels  Start by checking the serving size on the  label. The amount of calories, carbohydrates, fats, and other nutrients listed on the label are based on one serving of the food. Many foods contain more than one serving per package.  Check the total grams (g) of carbohydrates in one serving. You can calculate the number of servings of carbohydrates in one serving by dividing the total carbohydrates by 15. For example, if a food has 30 g of total carbohydrates, it would be equal to 2 servings of carbohydrates.  Check the number of grams (g) of saturated and trans fats in one serving. Choose foods that have low or no amount of these fats.  Check the number of milligrams (mg) of sodium in one serving. Most people should limit total sodium intake to less than 2,300 mg per day.  Always check the nutrition information of foods labeled as "low-fat" or "nonfat". These foods may be higher in added sugar or refined carbohydrates and should be avoided.  Talk to your dietitian to identify your daily goals for nutrients listed on the label. Shopping  Avoid buying canned, premade, or processed foods. These foods tend to be high in fat, sodium, and added sugar.  Shop around the outside edge of the grocery store. This includes fresh fruits and vegetables, bulk grains, fresh meats, and fresh dairy. Cooking  Use low-heat cooking methods, such as baking, instead of high-heat cooking methods like deep frying.  Cook using healthy oils, such as olive, canola, or sunflower oil.  Avoid cooking with butter, cream, or high-fat meats. Meal planning  Eat meals and snacks regularly, preferably at the  same times every day. Avoid going long periods of time without eating.  Eat foods high in fiber, such as fresh fruits, vegetables, beans, and whole grains. Talk to your dietitian about how many servings of carbohydrates you can eat at each meal.  Eat 4-6 ounces of lean protein each day, such as lean meat, chicken, fish, eggs, or tofu. 1 ounce is equal to 1 ounce of  meat, chicken, or fish, 1 egg, or 1/4 cup of tofu.  Eat some foods each day that contain healthy fats, such as avocado, nuts, seeds, and fish. Lifestyle   Check your blood glucose regularly.  Exercise at least 30 minutes 5 or more days each week, or as told by your health care provider.  Take medicines as told by your health care provider.  Do not use any products that contain nicotine or tobacco, such as cigarettes and e-cigarettes. If you need help quitting, ask your health care provider.  Work with a Social worker or diabetes educator to identify strategies to manage stress and any emotional and social challenges. What are some questions to ask my health care provider?  Do I need to meet with a diabetes educator?  Do I need to meet with a dietitian?  What number can I call if I have questions?  When are the best times to check my blood glucose? Where to find more information:  American Diabetes Association: diabetes.org/food-and-fitness/food  Academy of Nutrition and Dietetics: PokerClues.dk  [employer] of Diabetes and Digestive and Kidney Diseases (NIH): ContactWire.be Summary  A healthy meal plan will help you control your blood glucose and maintain a healthy lifestyle.  Working with a diet and nutrition specialist (dietitian) can help you make a meal plan that is best for you.  Keep in mind that carbohydrates and alcohol have immediate effects on your blood glucose levels. It is important to count carbohydrates and to use alcohol carefully. This information is not intended to replace advice given to you by your health care provider. Make sure you discuss any questions you have with your health care provider. Document Released: 08/27/2005 Document Revised: 01/04/2017 Document Reviewed: 01/04/2017 Elsevier Interactive Patient Education  2018  [employer].  Hypertension Hypertension, commonly called high blood pressure, is when the force of blood pumping through the arteries is too strong. The arteries are the blood vessels that carry blood from the heart throughout the body. Hypertension forces the heart to work harder to pump blood and may cause arteries to become narrow or stiff. Having untreated or uncontrolled hypertension can cause heart attacks, strokes, kidney disease, and other problems. A blood pressure reading consists of a higher number over a lower number. Ideally, your blood pressure should be below 120/80. The first ("top") number is called the systolic pressure. It is a measure of the pressure in your arteries as your heart beats. The second ("bottom") number is called the diastolic pressure. It is a measure of the pressure in your arteries as the heart relaxes. What are the causes? The cause of this condition is not known. What increases the risk? Some risk factors for high blood pressure are under your control. Others are not. Factors you can change  Smoking.  Having type 2 diabetes mellitus, high cholesterol, or both.  Not getting enough exercise or physical activity.  Being overweight.  Having too much fat, sugar, calories, or salt (sodium) in your diet.  Drinking too much alcohol. Factors that are difficult or impossible to change  Having chronic kidney disease.  Having a family history of high blood pressure.  Age. Risk increases with age.  Race. You may be at higher risk if you are African-American.  Gender. Men are at higher risk than women before age 35. After age 55, women are at higher risk than men.  Having obstructive sleep apnea.  Stress. What are the signs or symptoms? Extremely high blood pressure (hypertensive crisis) may cause:  Headache.  Anxiety.  Shortness of breath.  Nosebleed.  Nausea and vomiting.  Severe chest pain.  Jerky movements you cannot control  (seizures).  How is this diagnosed? This condition is diagnosed by measuring your blood pressure while you are seated, with your arm resting on a surface. The cuff of the blood pressure monitor will be placed directly against the skin of your upper arm at the level of your heart. It should be measured at least twice using the same arm. Certain conditions can cause a difference in blood pressure between your right and left arms. Certain factors can cause blood pressure readings to be lower or higher than normal (elevated) for a short period of time:  When your blood pressure is higher when you are in a health care provider's office than when you are at home, this is called white coat hypertension. Most people with this condition do not need medicines.  When your blood pressure is higher at home than when you are in a health care provider's office, this is called masked hypertension. Most people with this condition may need medicines to control blood pressure.  If you have a high blood pressure reading during one visit or you have normal blood pressure with other risk factors:  You may be asked to return on a different day to have your blood pressure checked again.  You may be asked to monitor your blood pressure at home for 1 week or longer.  If you are diagnosed with hypertension, you may have other blood or imaging tests to help your health care provider understand your overall risk for other conditions. How is this treated? This condition is treated by making healthy lifestyle changes, such as eating healthy foods, exercising more, and reducing your alcohol intake. Your health care provider may prescribe medicine if lifestyle changes are not enough to get your blood pressure under control, and if:  Your systolic blood pressure is above 130.  Your diastolic blood pressure is above 80.  Your personal target blood pressure may vary depending on your medical conditions, your age, and other  factors. Follow these instructions at home: Eating and drinking  Eat a diet that is high in fiber and potassium, and low in sodium, added sugar, and fat. An example eating plan is called the DASH (Dietary Approaches to Stop Hypertension) diet. To eat this way: ? Eat plenty of fresh fruits and vegetables. Try to fill half of your plate at each meal with fruits and vegetables. ? Eat whole grains, such as whole wheat pasta, brown rice, or whole grain bread. Fill about one quarter of your plate with whole grains. ? Eat or drink low-fat dairy products, such as skim milk or low-fat yogurt. ? Avoid fatty cuts of meat, processed or cured meats, and poultry with skin. Fill about one quarter of your plate with lean proteins, such as fish, chicken without skin, beans, eggs, and tofu. ? Avoid premade and processed foods. These tend to be higher in sodium, added sugar, and fat.  Reduce your daily sodium intake. Most people with hypertension should eat  less than 1,500 mg of sodium a day.  Limit alcohol intake to no more than 1 drink a day for nonpregnant women and 2 drinks a day for men. One drink equals 12 oz of beer, 5 oz of wine, or 1 oz of hard liquor. Lifestyle  Work with your health care provider to maintain a healthy body weight or to lose weight. Ask what an ideal weight is for you.  Get at least 30 minutes of exercise that causes your heart to beat faster (aerobic exercise) most days of the week. Activities may include walking, swimming, or biking.  Include exercise to strengthen your muscles (resistance exercise), such as pilates or lifting weights, as part of your weekly exercise routine. Try to do these types of exercises for 30 minutes at least 3 days a week.  Do not use any products that contain nicotine or tobacco, such as cigarettes and e-cigarettes. If you need help quitting, ask your health care provider.  Monitor your blood pressure at home as told by your health care provider.  Keep  all follow-up visits as told by your health care provider. This is important. Medicines  Take over-the-counter and prescription medicines only as told by your health care provider. Follow directions carefully. Blood pressure medicines must be taken as prescribed.  Do not skip doses of blood pressure medicine. Doing this puts you at risk for problems and can make the medicine less effective.  Ask your health care provider about side effects or reactions to medicines that you should watch for. Contact a health care provider if:  You think you are having a reaction to a medicine you are taking.  You have headaches that keep coming back (recurring).  You feel dizzy.  You have swelling in your ankles.  You have trouble with your vision. Get help right away if:  You develop a severe headache or confusion.  You have unusual weakness or numbness.  You feel faint.  You have severe pain in your chest or abdomen.  You vomit repeatedly.  You have trouble breathing. Summary  Hypertension is when the force of blood pumping through your arteries is too strong. If this condition is not controlled, it may put you at risk for serious complications.  Your personal target blood pressure may vary depending on your medical conditions, your age, and other factors. For most people, a normal blood pressure is less than 120/80.  Hypertension is treated with lifestyle changes, medicines, or a combination of both. Lifestyle changes include weight loss, eating a healthy, low-sodium diet, exercising more, and limiting alcohol. This information is not intended to replace advice given to you by your health care provider. Make sure you discuss any questions you have with your health care provider. Document Released: 11/30/2005 Document Revised: 10/28/2016 Document Reviewed: 10/28/2016 Elsevier Interactive Patient Education  2018 Elsevier Inc.      Agustina Caroli, MD [facility]

## 2018-11-16 ENCOUNTER — Encounter: Payer: Self-pay | Admitting: Emergency Medicine

## 2018-11-16 LAB — CBC WITH DIFFERENTIAL/PLATELET
BASOS ABS: 0.1 10*3/uL (ref 0.0–0.2)
Basos: 1 %
EOS (ABSOLUTE): 0.2 10*3/uL (ref 0.0–0.4)
Eos: 3 %
HEMOGLOBIN: 13 g/dL (ref 13.0–17.7)
Hematocrit: 40.4 % (ref 37.5–51.0)
IMMATURE GRANS (ABS): 0 10*3/uL (ref 0.0–0.1)
Immature Granulocytes: 1 %
LYMPHS ABS: 2.2 10*3/uL (ref 0.7–3.1)
LYMPHS: 33 %
MCH: 25.8 pg — ABNORMAL LOW (ref 26.6–33.0)
MCHC: 32.2 g/dL (ref 31.5–35.7)
MCV: 80 fL (ref 79–97)
MONOCYTES: 8 %
Monocytes Absolute: 0.6 10*3/uL (ref 0.1–0.9)
Neutrophils Absolute: 3.6 10*3/uL (ref 1.4–7.0)
Neutrophils: 54 %
Platelets: 400 10*3/uL (ref 150–450)
RBC: 5.04 x10E6/uL (ref 4.14–5.80)
RDW: 12.9 % (ref 12.3–15.4)
WBC: 6.6 10*3/uL (ref 3.4–10.8)

## 2018-11-16 LAB — COMPREHENSIVE METABOLIC PANEL
ALBUMIN: 4.4 g/dL (ref 3.5–5.5)
ALK PHOS: 67 IU/L (ref 39–117)
ALT: 38 IU/L (ref 0–44)
AST: 39 IU/L (ref 0–40)
Albumin/Globulin Ratio: 1.3 (ref 1.2–2.2)
BUN / CREAT RATIO: 9 (ref 9–20)
BUN: 11 mg/dL (ref 6–24)
Bilirubin Total: 0.5 mg/dL (ref 0.0–1.2)
CHLORIDE: 98 mmol/L (ref 96–106)
CO2: 26 mmol/L (ref 20–29)
CREATININE: 1.25 mg/dL (ref 0.76–1.27)
Calcium: 9.8 mg/dL (ref 8.7–10.2)
GFR calc Af Amer: 77 mL/min/{1.73_m2} (ref >59)
GFR calc non Af Amer: 67 mL/min/{1.73_m2} (ref >59)
GLUCOSE: 188 mg/dL — AB (ref 65–99)
Globulin, Total: 3.5 g/dL (ref 1.5–4.5)
Potassium: 4.5 mmol/L (ref 3.5–5.2)
Sodium: 139 mmol/L (ref 134–144)
Total Protein: 7.9 g/dL (ref 6.0–8.5)

## 2019-02-17 ENCOUNTER — Ambulatory Visit: Payer: BLUE CROSS/BLUE SHIELD | Admitting: Emergency Medicine

## 2019-07-20 ENCOUNTER — Encounter: Payer: Self-pay | Admitting: Emergency Medicine

## 2019-07-20 ENCOUNTER — Other Ambulatory Visit: Payer: Self-pay

## 2019-07-20 ENCOUNTER — Ambulatory Visit (INDEPENDENT_AMBULATORY_CARE_PROVIDER_SITE_OTHER): Payer: 59 | Admitting: Emergency Medicine

## 2019-07-20 VITALS — BP 116/74 | HR 105 | Temp 98.8°F | Resp 16 | Wt 222.8 lb

## 2019-07-20 DIAGNOSIS — R7303 Prediabetes: Secondary | ICD-10-CM | POA: Diagnosis not present

## 2019-07-20 DIAGNOSIS — Z8249 Family history of ischemic heart disease and other diseases of the circulatory system: Secondary | ICD-10-CM

## 2019-07-20 DIAGNOSIS — R079 Chest pain, unspecified: Secondary | ICD-10-CM

## 2019-07-20 DIAGNOSIS — I10 Essential (primary) hypertension: Secondary | ICD-10-CM | POA: Diagnosis not present

## 2019-07-20 LAB — GLUCOSE, POCT (MANUAL RESULT ENTRY): POC Glucose: 134 mg/dl — AB (ref 70–99)

## 2019-07-20 LAB — POCT GLYCOSYLATED HEMOGLOBIN (HGB A1C): Hemoglobin A1C: 7 % — AB (ref 4.0–5.6)

## 2019-07-20 MED ORDER — ACCU-CHEK AVIVA PLUS VI STRP: ORAL_STRIP | 3 refills | Status: AC

## 2019-07-20 NOTE — Progress Notes (Signed)
BP Readings from Last 3 Encounters:  07/20/19 116/74  11/15/18 (!) 150/80  06/02/16 (!) 129/94   Lab Results  Component Value Date   HGBA1C 6.2 (A) 11/15/2018   Wt Readings from Last 3 Encounters:  07/20/19 222 lb 12.8 oz (101.1 kg)  11/15/18 228 lb 4.8 oz (103.6 kg)  06/02/16 220 lb (99.8 kg)   Paul Morgan 53 y.o.   Chief Complaint  Patient presents with   Diabetes    6 months follow up and medication refill - [facility]- Chek AVIVA plus strips   Hypertension   Chest Pain    1 1/2 weeks    HISTORY OF PRESENT ILLNESS: This is a 53 y.o. male with history of hypertension and prediabetes here for follow-up and medication refill.  Very physically active at work.  No chest pain on exertion.  No dyspnea on exertion.  Has been having an atypical left-sided chest pain for the past 1 and half weeks.  Short lived episodes of left-sided discomfort lasting several seconds with no radiation and no associated symptoms.  No other significant symptoms.  HPI   Prior to Admission medications   Medication Sig Start Date End Date Taking? Authorizing Provider  amLODipine (NORVASC) 10 MG tablet Take 1 tablet (10 mg total) by mouth daily. 11/15/18  Yes Horald Pollen, MD  aspirin EC 81 MG tablet Take 81 mg by mouth daily.   Yes [provider]  glucose blood test strip 1 each by Other route daily. Use as instructed   Yes [provider]  loratadine (CLARITIN) 10 MG tablet Take 10 mg by mouth daily as needed for allergies.    Yes [provider]  metFORMIN (GLUCOPHAGE) 500 MG tablet Take 1 tablet (500 mg total) by mouth 2 (two) times daily with a meal. 11/15/18  Yes Jobie Popp, Ines Bloomer, MD  Multiple Vitamin (MULTIVITAMIN) tablet Take 1 tablet by mouth daily.   Yes [provider]  Aspirin-Acetaminophen-Caffeine (GOODY HEADACHE PO) Take 1 packet by mouth daily as needed (headaches).    [provider]  docusate sodium (COLACE) 100 MG capsule Take 1  capsule (100 mg total) by mouth 2 (two) times daily. Patient not taking: Reported on 07/20/2019 06/02/16   Ditty, Kevan Ny, MD  gabapentin (NEURONTIN) 300 MG capsule Take 1 capsule (300 mg total) by mouth 3 (three) times daily. Patient not taking: Reported on 07/20/2019 06/02/16   Ditty, Kevan Ny, MD  lisinopril (PRINIVIL,ZESTRIL) 10 MG tablet Take 1 tablet (10 mg total) by mouth daily. 11/15/18 02/13/19  Horald Pollen, MD  methocarbamol (ROBAXIN) 750 MG tablet Take 1 tablet (750 mg total) by mouth 4 (four) times daily. Patient not taking: Reported on 07/20/2019 06/02/16   Ditty, Kevan Ny, MD  oxyCODONE-acetaminophen (ROXICET) 5-325 MG tablet Take 1-2 tablets by mouth every 6 (six) hours as needed for severe pain. Patient not taking: Reported on 07/20/2019 06/02/16   Ditty, Kevan Ny, MD    No Known Allergies  Patient Active Problem List   Diagnosis Date Noted   Essential hypertension 11/15/2018   Prediabetes 11/15/2018   Cervical spondylosis with myelopathy 06/02/2016    Past Medical History:  Diagnosis Date   Anxiety    Cervical spondylosis with myelopathy    Family history of adverse reaction to anesthesia    " My dad was a little nauseous after he woke up."   Hydrocele 02/2010   Pneumonia    hx of as a child     Past  Surgical History:  Procedure Laterality Date   ANTERIOR CERVICAL DECOMP/DISCECTOMY FUSION N/A 06/02/2016   Procedure: Cervical four - five Cervical five - six Anterior cervical decompression/diskectomy/fusion;  Surgeon: Kevan Ny Ditty, MD;  Location: [facility];  Service: Neurosurgery;  Laterality: N/A;  C4-5 C5-6 Anterior cervical decompression/diskectomy/fusion   HYDROCELE EXCISION Bilateral 09/12/2014   Procedure: BILATERAL HYDROCELECTOMY ADULT;  Surgeon: Ardis Hughs, MD;  Location: [facility];  Service: Urology;  Laterality: Bilateral;    Social History   Socioeconomic History   Marital status: Married    Spouse name:  Not on file   Number of children: Not on file   Years of education: Not on file   Highest education level: Not on file  Occupational History   Not on file  Social Needs   Financial resource strain: Not on file   Food insecurity    Worry: Not on file    Inability: Not on file   Transportation needs    Medical: Not on file    Non-medical: Not on file  Tobacco Use   Smoking status: Current Every Day Smoker    Packs/day: 1.00    Years: 16.00    Pack years: 16.00    Types: Cigarettes    Start date: 03/30/1997   Smokeless tobacco: Never Used  Substance and Sexual Activity   Alcohol use: Yes    Comment: social   Drug use: No   Sexual activity: Not on file  Lifestyle   Physical activity    Days per week: Not on file    Minutes per session: Not on file   Stress: Not on file  Relationships   Social connections    Talks on phone: Not on file    Gets together: Not on file    Attends religious service: Not on file    Active member of club or organization: Not on file    Attends meetings of clubs or organizations: Not on file    Relationship status: Not on file   Intimate partner violence    Fear of current or ex partner: Not on file    Emotionally abused: Not on file    Physically abused: Not on file    Forced sexual activity: Not on file  Other Topics Concern   Not on file  Social History Narrative   Not on file    Family History  Problem Relation Age of Onset   Diabetes Mother    COPD Mother    Cancer Father    Heart disease Sister    Drug abuse Brother    Diabetes Brother    COPD Brother    Cancer Sister    Diabetes Brother      Review of Systems  Constitutional: Negative.  Negative for chills and fever.  HENT: Negative.  Negative for congestion and sore throat.   Eyes: Negative.   Respiratory: Negative.  Negative for cough and shortness of breath.   Cardiovascular: Positive for chest pain. Negative for palpitations, claudication,  leg swelling and PND.  Gastrointestinal: Negative.  Negative for abdominal pain, diarrhea, nausea and vomiting.  Genitourinary: Negative.  Negative for dysuria.  Musculoskeletal: Negative.  Negative for myalgias.  Skin: Negative.  Negative for rash.  Neurological: Negative.  Negative for dizziness and headaches.  Endo/Heme/Allergies: Negative.   All other systems reviewed and are negative.   Vitals:   07/20/19 1637  BP: 116/74  Pulse: (!) 105  Resp: 16  Temp: 98.8 F (37.1 C)  SpO2: 100%    Physical Exam Vitals signs reviewed.  Constitutional:      Appearance: He is well-developed.  HENT:     Head: Normocephalic and atraumatic.  Eyes:     Extraocular Movements: Extraocular movements intact.     Conjunctiva/sclera: Conjunctivae normal.     Pupils: Pupils are equal, round, and reactive to light.  Neck:     Musculoskeletal: Normal range of motion and neck supple.  Cardiovascular:     Rate and Rhythm: Normal rate and regular rhythm.     Pulses: Normal pulses.     Heart sounds: Normal heart sounds.  Pulmonary:     Effort: Pulmonary effort is normal.     Breath sounds: Normal breath sounds.  Abdominal:     Palpations: Abdomen is soft.     Tenderness: There is no abdominal tenderness.  Musculoskeletal: Normal range of motion.  Skin:    General: Skin is warm and dry.     Capillary Refill: Capillary refill takes less than 2 seconds.  Neurological:     General: No focal deficit present.     Mental Status: He is alert and oriented to person, place, and time.  Psychiatric:        Mood and Affect: Mood normal.        Behavior: Behavior normal.    Results for orders placed or performed in visit on 07/20/19 (from the past 24 hour(s))  POCT glucose (manual entry)     Status: Abnormal   Collection Time: 07/20/19  5:15 PM  Result Value Ref Range   POC Glucose 134 (A) 70 - 99 mg/dl  POCT glycosylated hemoglobin (Hb A1C)     Status: Abnormal   Collection Time: 07/20/19  5:28  PM  Result Value Ref Range   Hemoglobin A1C 7.0 (A) 4.0 - 5.6 %   HbA1c POC (<> result, manual entry)     HbA1c, POC (prediabetic range)     HbA1c, POC (controlled diabetic range)      EKG: Normal sinus rhythm with ventricular rate of 98.  No acute ischemic changes.  Normal EKG. ASSESSMENT & PLAN: Paul Morgan was seen today for diabetes, hypertension and chest pain.  Diagnoses and all orders for this visit:  Chest pain, unspecified type -     EKG 12-Lead -     CBC with Differential/Platelet -     Comprehensive metabolic panel -     Lipid panel -     Ambulatory referral to Cardiology  Prediabetes -     POCT glucose (manual entry) -     POCT glycosylated hemoglobin (Hb A1C) -     CBC with Differential/Platelet -     Comprehensive metabolic panel -     Lipid panel -     glucose blood (ACCU-CHEK AVIVA PLUS) test strip; Use as instructed to test blood sugar daily.  Essential hypertension  Family history of cardiovascular disease    Patient Instructions       If you have lab work done today you will be contacted with your lab results within the next 2 weeks.  If you have not heard from [place] then please contact us. The fastest way to get your results is to register for My Chart.   IF you received an x-ray today, you will receive an invoice from [facility]. Please contact [facility] at [phone] with questions or concerns regarding your invoice.   IF you received labwork today, you will receive an invoice from [facility]. Please  contact [facility] at [phone] with questions or concerns regarding your invoice.   Our billing staff will not be able to assist you with questions regarding bills from these companies.  You will be contacted with the lab results as soon as they are available. The fastest way to get your results is to activate your My Chart account. Instructions are located on the last page of this paperwork. If you have not heard from [place] regarding  the results in 2 weeks, please contact this office.     Nonspecific Chest Pain Chest pain can be caused by many different conditions. Some causes of chest pain can be life-threatening. These will require treatment right away. Serious causes of chest pain include:  Heart attack.  A tear in the body's main blood vessel.  Redness and swelling (inflammation) around your heart.  Blood clot in your lungs. Other causes of chest pain may not be so serious. These include:  Heartburn.  Anxiety or stress.  Damage to bones or muscles in your chest.  Lung infections. Chest pain can feel like:  Pain or discomfort in your chest.  Crushing, pressure, aching, or squeezing pain.  Burning or tingling.  Dull or sharp pain that is worse when you move, cough, or take a deep breath.  Pain or discomfort that is also felt in your back, neck, jaw, shoulder, or arm, or pain that spreads to any of these areas. It is hard to know whether your pain is caused by something that is serious or something that is not so serious. So it is important to see your doctor right away if you have chest pain. Follow these instructions at home: Medicines  Take over-the-counter and prescription medicines only as told by your doctor.  If you were prescribed an antibiotic medicine, take it as told by your doctor. Do not stop taking the antibiotic even if you start to feel better. Lifestyle   Rest as told by your doctor.  Do not use any products that contain nicotine or tobacco, such as cigarettes, e-cigarettes, and chewing tobacco. If you need help quitting, ask your doctor.  Do not drink alcohol.  Make lifestyle changes as told by your doctor. These may include: ? Getting regular exercise. Ask your doctor what activities are safe for you. ? Eating a heart-healthy diet. A diet and nutrition specialist (dietitian) can help you to learn healthy eating options. ? Staying at a healthy weight. ? Treating diabetes or  high blood pressure, if needed. ? Lowering your stress. Activities such as yoga and relaxation techniques can help. General instructions  Pay attention to any changes in your symptoms. Tell your doctor about them or any new symptoms.  Avoid any activities that cause chest pain.  Keep all follow-up visits as told by your doctor. This is important. You may need more testing if your chest pain does not go away. Contact a doctor if:  Your chest pain does not go away.  You feel depressed.  You have a fever. Get help right away if:  Your chest pain is worse.  You have a cough that gets worse, or you cough up blood.  You have very bad (severe) pain in your belly (abdomen).  You pass out (faint).  You have either of these for no clear reason: ? Sudden chest discomfort. ? Sudden discomfort in your arms, back, neck, or jaw.  You have shortness of breath at any time.  You suddenly start to sweat, or your skin gets clammy.  You feel sick to your stomach (nauseous).  You throw up (vomit).  You suddenly feel lightheaded or dizzy.  You feel very weak or tired.  Your heart starts to beat fast, or it feels like it is skipping beats. These symptoms may be an emergency. Do not wait to see if the symptoms will go away. Get medical help right away. Call your local emergency services (911 in the [place] Do not drive yourself to the hospital. Summary  Chest pain can be caused by many different conditions. The cause may be serious and need treatment right away. If you have chest pain, see your doctor right away.  Follow your doctor's instructions for taking medicines and making lifestyle changes.  Keep all follow-up visits as told by your doctor. This includes visits for any further testing if your chest pain does not go away.  Be sure to know the signs that show that your condition has become worse. Get help right away if you have these symptoms. This information is not intended to  replace advice given to you by your health care provider. Make sure you discuss any questions you have with your health care provider. Document Released: 05/18/2008 Document Revised: 06/02/2018 Document Reviewed: 06/02/2018 Elsevier Patient Education  2020 [employer].  Diabetes Mellitus and Nutrition, Adult When you have diabetes (diabetes mellitus), it is very important to have healthy eating habits because your blood sugar (glucose) levels are greatly affected by what you eat and drink. Eating healthy foods in the appropriate amounts, at about the same times every day, can help you:  Control your blood glucose.  Lower your risk of heart disease.  Improve your blood pressure.  Reach or maintain a healthy weight. Every person with diabetes is different, and each person has different needs for a meal plan. Your health care provider may recommend that you work with a diet and nutrition specialist (dietitian) to make a meal plan that is best for you. Your meal plan may vary depending on factors such as:  The calories you need.  The medicines you take.  Your weight.  Your blood glucose, blood pressure, and cholesterol levels.  Your activity level.  Other health conditions you have, such as heart or kidney disease. How do carbohydrates affect me? Carbohydrates, also called carbs, affect your blood glucose level more than any other type of food. Eating carbs naturally raises the amount of glucose in your blood. Carb counting is a method for keeping track of how many carbs you eat. Counting carbs is important to keep your blood glucose at a healthy level, especially if you use insulin or take certain oral diabetes medicines. It is important to know how many carbs you can safely have in each meal. This is different for every person. Your dietitian can help you calculate how many carbs you should have at each meal and for each snack. Foods that contain carbs include:  Bread, cereal, rice,  pasta, and crackers.  Potatoes and corn.  Peas, beans, and lentils.  Milk and yogurt.  Fruit and juice.  Desserts, such as cakes, cookies, ice cream, and candy. How does alcohol affect me? Alcohol can cause a sudden decrease in blood glucose (hypoglycemia), especially if you use insulin or take certain oral diabetes medicines. Hypoglycemia can be a life-threatening condition. Symptoms of hypoglycemia (sleepiness, dizziness, and confusion) are similar to symptoms of having too much alcohol. If your health care provider says that alcohol is safe for you, follow these guidelines:  Limit alcohol  intake to no more than 1 drink per day for nonpregnant women and 2 drinks per day for men. One drink equals 12 oz of beer, 5 oz of wine, or 1 oz of hard liquor.  Do not drink on an empty stomach.  Keep yourself hydrated with water, diet soda, or unsweetened iced tea.  Keep in mind that regular soda, juice, and other mixers may contain a lot of sugar and must be counted as carbs. What are tips for following this plan?  Reading food labels  Start by checking the serving size on the "Nutrition Facts" label of packaged foods and drinks. The amount of calories, carbs, fats, and other nutrients listed on the label is based on one serving of the item. Many items contain more than one serving per package.  Check the total grams (g) of carbs in one serving. You can calculate the number of servings of carbs in one serving by dividing the total carbs by 15. For example, if a food has 30 g of total carbs, it would be equal to 2 servings of carbs.  Check the number of grams (g) of saturated and trans fats in one serving. Choose foods that have low or no amount of these fats.  Check the number of milligrams (mg) of salt (sodium) in one serving. Most people should limit total sodium intake to less than 2,300 mg per day.  Always check the nutrition information of foods labeled as "low-fat" or "nonfat". These  foods may be higher in added sugar or refined carbs and should be avoided.  Talk to your dietitian to identify your daily goals for nutrients listed on the label. Shopping  Avoid buying canned, premade, or processed foods. These foods tend to be high in fat, sodium, and added sugar.  Shop around the outside edge of the grocery store. This includes fresh fruits and vegetables, bulk grains, fresh meats, and fresh dairy. Cooking  Use low-heat cooking methods, such as baking, instead of high-heat cooking methods like deep frying.  Cook using healthy oils, such as olive, canola, or sunflower oil.  Avoid cooking with butter, cream, or high-fat meats. Meal planning  Eat meals and snacks regularly, preferably at the same times every day. Avoid going long periods of time without eating.  Eat foods high in fiber, such as fresh fruits, vegetables, beans, and whole grains. Talk to your dietitian about how many servings of carbs you can eat at each meal.  Eat 4-6 ounces (oz) of lean protein each day, such as lean meat, chicken, fish, eggs, or tofu. One oz of lean protein is equal to: ? 1 oz of meat, chicken, or fish. ? 1 egg. ?  cup of tofu.  Eat some foods each day that contain healthy fats, such as avocado, nuts, seeds, and fish. Lifestyle  Check your blood glucose regularly.  Exercise regularly as told by your health care provider. This may include: ? 150 minutes of moderate-intensity or vigorous-intensity exercise each week. This could be brisk walking, biking, or water aerobics. ? Stretching and doing strength exercises, such as yoga or weightlifting, at least 2 times a week.  Take medicines as told by your health care provider.  Do not use any products that contain nicotine or tobacco, such as cigarettes and e-cigarettes. If you need help quitting, ask your health care provider.  Work with a Social worker or diabetes educator to identify strategies to manage stress and any emotional and  social challenges. Questions to ask a health care  provider  Do I need to meet with a diabetes educator?  Do I need to meet with a dietitian?  What number can I call if I have questions?  When are the best times to check my blood glucose? Where to find more information:  American Diabetes Association: diabetes.org  Academy of Nutrition and Dietetics: www.eatright.[employer] of Diabetes and Digestive and Kidney Diseases (NIH): [employer] Summary  A healthy meal plan will help you control your blood glucose and maintain a healthy lifestyle.  Working with a diet and nutrition specialist (dietitian) can help you make a meal plan that is best for you.  Keep in mind that carbohydrates (carbs) and alcohol have immediate effects on your blood glucose levels. It is important to count carbs and to use alcohol carefully. This information is not intended to replace advice given to you by your health care provider. Make sure you discuss any questions you have with your health care provider. Document Released: 08/27/2005 Document Revised: 11/12/2017 Document Reviewed: 01/04/2017 Elsevier Patient Education  2020 Elsevier Inc.      Agustina Caroli, MD [facility]

## 2019-07-20 NOTE — Patient Instructions (Addendum)
   If you have lab work done today you will be contacted with your lab results within the next 2 weeks.  If you have not heard from us then please contact us. The fastest way to get your results is to register for My Chart.   IF you received an x-ray today, you will receive an invoice from [facility]. Please contact [facility] at [phone] with questions or concerns regarding your invoice.   IF you received labwork today, you will receive an invoice from [facility]. Please contact [facility] at [phone] with questions or concerns regarding your invoice.   Our billing staff will not be able to assist you with questions regarding bills from these companies.  You will be contacted with the lab results as soon as they are available. The fastest way to get your results is to activate your My Chart account. Instructions are located on the last page of this paperwork. If you have not heard from us regarding the results in 2 weeks, please contact this office.     Nonspecific Chest Pain Chest pain can be caused by many different conditions. Some causes of chest pain can be life-threatening. These will require treatment right away. Serious causes of chest pain include:  Heart attack.  A tear in the body's main blood vessel.  Redness and swelling (inflammation) around your heart.  Blood clot in your lungs. Other causes of chest pain may not be so serious. These include:  Heartburn.  Anxiety or stress.  Damage to bones or muscles in your chest.  Lung infections. Chest pain can feel like:  Pain or discomfort in your chest.  Crushing, pressure, aching, or squeezing pain.  Burning or tingling.  Dull or sharp pain that is worse when you move, cough, or take a deep breath.  Pain or discomfort that is also felt in your back, neck, jaw, shoulder, or arm, or pain that spreads to any of these areas. It is hard to know whether your pain is caused by something that is  serious or something that is not so serious. So it is important to see your doctor right away if you have chest pain. Follow these instructions at home: Medicines  Take over-the-counter and prescription medicines only as told by your doctor.  If you were prescribed an antibiotic medicine, take it as told by your doctor. Do not stop taking the antibiotic even if you start to feel better. Lifestyle   Rest as told by your doctor.  Do not use any products that contain nicotine or tobacco, such as cigarettes, e-cigarettes, and chewing tobacco. If you need help quitting, ask your doctor.  Do not drink alcohol.  Make lifestyle changes as told by your doctor. These may include: ? Getting regular exercise. Ask your doctor what activities are safe for you. ? Eating a heart-healthy diet. A diet and nutrition specialist (dietitian) can help you to learn healthy eating options. ? Staying at a healthy weight. ? Treating diabetes or high blood pressure, if needed. ? Lowering your stress. Activities such as yoga and relaxation techniques can help. General instructions  Pay attention to any changes in your symptoms. Tell your doctor about them or any new symptoms.  Avoid any activities that cause chest pain.  Keep all follow-up visits as told by your doctor. This is important. You may need more testing if your chest pain does not go away. Contact a doctor if:  Your chest pain does not go away.  You feel   depressed.  You have a fever. Get help right away if:  Your chest pain is worse.  You have a cough that gets worse, or you cough up blood.  You have very bad (severe) pain in your belly (abdomen).  You pass out (faint).  You have either of these for no clear reason: ? Sudden chest discomfort. ? Sudden discomfort in your arms, back, neck, or jaw.  You have shortness of breath at any time.  You suddenly start to sweat, or your skin gets clammy.  You feel sick to your stomach  (nauseous).  You throw up (vomit).  You suddenly feel lightheaded or dizzy.  You feel very weak or tired.  Your heart starts to beat fast, or it feels like it is skipping beats. These symptoms may be an emergency. Do not wait to see if the symptoms will go away. Get medical help right away. Call your local emergency services (911 in the [place] Do not drive yourself to the hospital. Summary  Chest pain can be caused by many different conditions. The cause may be serious and need treatment right away. If you have chest pain, see your doctor right away.  Follow your doctor's instructions for taking medicines and making lifestyle changes.  Keep all follow-up visits as told by your doctor. This includes visits for any further testing if your chest pain does not go away.  Be sure to know the signs that show that your condition has become worse. Get help right away if you have these symptoms. This information is not intended to replace advice given to you by your health care provider. Make sure you discuss any questions you have with your health care provider. Document Released: 05/18/2008 Document Revised: 06/02/2018 Document Reviewed: 06/02/2018 Elsevier Patient Education  2020 [employer].  Diabetes Mellitus and Nutrition, Adult When you have diabetes (diabetes mellitus), it is very important to have healthy eating habits because your blood sugar (glucose) levels are greatly affected by what you eat and drink. Eating healthy foods in the appropriate amounts, at about the same times every day, can help you:  Control your blood glucose.  Lower your risk of heart disease.  Improve your blood pressure.  Reach or maintain a healthy weight. Every person with diabetes is different, and each person has different needs for a meal plan. Your health care provider may recommend that you work with a diet and nutrition specialist (dietitian) to make a meal plan that is best for you. Your meal plan  may vary depending on factors such as:  The calories you need.  The medicines you take.  Your weight.  Your blood glucose, blood pressure, and cholesterol levels.  Your activity level.  Other health conditions you have, such as heart or kidney disease. How do carbohydrates affect me? Carbohydrates, also called carbs, affect your blood glucose level more than any other type of food. Eating carbs naturally raises the amount of glucose in your blood. Carb counting is a method for keeping track of how many carbs you eat. Counting carbs is important to keep your blood glucose at a healthy level, especially if you use insulin or take certain oral diabetes medicines. It is important to know how many carbs you can safely have in each meal. This is different for every person. Your dietitian can help you calculate how many carbs you should have at each meal and for each snack. Foods that contain carbs include:  Bread, cereal, rice, pasta, and crackers.  Potatoes  and corn.  Peas, beans, and lentils.  Milk and yogurt.  Fruit and juice.  Desserts, such as cakes, cookies, ice cream, and candy. How does alcohol affect me? Alcohol can cause a sudden decrease in blood glucose (hypoglycemia), especially if you use insulin or take certain oral diabetes medicines. Hypoglycemia can be a life-threatening condition. Symptoms of hypoglycemia (sleepiness, dizziness, and confusion) are similar to symptoms of having too much alcohol. If your health care provider says that alcohol is safe for you, follow these guidelines:  Limit alcohol intake to no more than 1 drink per day for nonpregnant women and 2 drinks per day for men. One drink equals 12 oz of beer, 5 oz of wine, or 1 oz of hard liquor.  Do not drink on an empty stomach.  Keep yourself hydrated with water, diet soda, or unsweetened iced tea.  Keep in mind that regular soda, juice, and other mixers may contain a lot of sugar and must be counted as  carbs. What are tips for following this plan?  Reading food labels  Start by checking the serving size on the "Nutrition Facts" label of packaged foods and drinks. The amount of calories, carbs, fats, and other nutrients listed on the label is based on one serving of the item. Many items contain more than one serving per package.  Check the total grams (g) of carbs in one serving. You can calculate the number of servings of carbs in one serving by dividing the total carbs by 15. For example, if a food has 30 g of total carbs, it would be equal to 2 servings of carbs.  Check the number of grams (g) of saturated and trans fats in one serving. Choose foods that have low or no amount of these fats.  Check the number of milligrams (mg) of salt (sodium) in one serving. Most people should limit total sodium intake to less than 2,300 mg per day.  Always check the nutrition information of foods labeled as "low-fat" or "nonfat". These foods may be higher in added sugar or refined carbs and should be avoided.  Talk to your dietitian to identify your daily goals for nutrients listed on the label. Shopping  Avoid buying canned, premade, or processed foods. These foods tend to be high in fat, sodium, and added sugar.  Shop around the outside edge of the grocery store. This includes fresh fruits and vegetables, bulk grains, fresh meats, and fresh dairy. Cooking  Use low-heat cooking methods, such as baking, instead of high-heat cooking methods like deep frying.  Cook using healthy oils, such as olive, canola, or sunflower oil.  Avoid cooking with butter, cream, or high-fat meats. Meal planning  Eat meals and snacks regularly, preferably at the same times every day. Avoid going long periods of time without eating.  Eat foods high in fiber, such as fresh fruits, vegetables, beans, and whole grains. Talk to your dietitian about how many servings of carbs you can eat at each meal.  Eat 4-6 ounces (oz)  of lean protein each day, such as lean meat, chicken, fish, eggs, or tofu. One oz of lean protein is equal to: ? 1 oz of meat, chicken, or fish. ? 1 egg. ?  cup of tofu.  Eat some foods each day that contain healthy fats, such as avocado, nuts, seeds, and fish. Lifestyle  Check your blood glucose regularly.  Exercise regularly as told by your health care provider. This may include: ? 150 minutes of moderate-intensity or vigorous-intensity  exercise each week. This could be brisk walking, biking, or water aerobics. ? Stretching and doing strength exercises, such as yoga or weightlifting, at least 2 times a week.  Take medicines as told by your health care provider.  Do not use any products that contain nicotine or tobacco, such as cigarettes and e-cigarettes. If you need help quitting, ask your health care provider.  Work with a Social worker or diabetes educator to identify strategies to manage stress and any emotional and social challenges. Questions to ask a health care provider  Do I need to meet with a diabetes educator?  Do I need to meet with a dietitian?  What number can I call if I have questions?  When are the best times to check my blood glucose? Where to find more information:  American Diabetes Association: diabetes.org  Academy of Nutrition and Dietetics: www.eatright.[employer] of Diabetes and Digestive and Kidney Diseases (NIH): [employer] Summary  A healthy meal plan will help you control your blood glucose and maintain a healthy lifestyle.  Working with a diet and nutrition specialist (dietitian) can help you make a meal plan that is best for you.  Keep in mind that carbohydrates (carbs) and alcohol have immediate effects on your blood glucose levels. It is important to count carbs and to use alcohol carefully. This information is not intended to replace advice given to you by your health care provider. Make sure you discuss any questions you  have with your health care provider. Document Released: 08/27/2005 Document Revised: 11/12/2017 Document Reviewed: 01/04/2017 Elsevier Patient Education  2020 [employer].

## 2019-07-21 LAB — COMPREHENSIVE METABOLIC PANEL
ALT: 39 IU/L (ref 0–44)
AST: 35 IU/L (ref 0–40)
Albumin/Globulin Ratio: 1.3 (ref 1.2–2.2)
Albumin: 4.3 g/dL (ref 3.8–4.9)
Alkaline Phosphatase: 56 IU/L (ref 39–117)
BUN/Creatinine Ratio: 9 (ref 9–20)
BUN: 11 mg/dL (ref 6–24)
Bilirubin Total: 0.6 mg/dL (ref 0.0–1.2)
CO2: 22 mmol/L (ref 20–29)
Calcium: 10 mg/dL (ref 8.7–10.2)
Chloride: 101 mmol/L (ref 96–106)
Creatinine, Ser: 1.19 mg/dL (ref 0.76–1.27)
GFR calc Af Amer: 81 mL/min/{1.73_m2} (ref >59)
GFR calc non Af Amer: 70 mL/min/{1.73_m2} (ref >59)
Globulin, Total: 3.4 g/dL (ref 1.5–4.5)
Glucose: 135 mg/dL — ABNORMAL HIGH (ref 65–99)
Potassium: 4.5 mmol/L (ref 3.5–5.2)
Sodium: 139 mmol/L (ref 134–144)
Total Protein: 7.7 g/dL (ref 6.0–8.5)

## 2019-07-21 LAB — CBC WITH DIFFERENTIAL/PLATELET
Basophils Absolute: 0 10*3/uL (ref 0.0–0.2)
Basos: 0 %
EOS (ABSOLUTE): 0.2 10*3/uL (ref 0.0–0.4)
Eos: 2 %
Hematocrit: 35.9 % — ABNORMAL LOW (ref 37.5–51.0)
Hemoglobin: 11.7 g/dL — ABNORMAL LOW (ref 13.0–17.7)
Immature Grans (Abs): 0 10*3/uL (ref 0.0–0.1)
Immature Granulocytes: 1 %
Lymphocytes Absolute: 2.4 10*3/uL (ref 0.7–3.1)
Lymphs: 32 %
MCH: 25.2 pg — ABNORMAL LOW (ref 26.6–33.0)
MCHC: 32.6 g/dL (ref 31.5–35.7)
MCV: 77 fL — ABNORMAL LOW (ref 79–97)
Monocytes Absolute: 0.6 10*3/uL (ref 0.1–0.9)
Monocytes: 8 %
Neutrophils Absolute: 4.3 10*3/uL (ref 1.4–7.0)
Neutrophils: 57 %
Platelets: 441 10*3/uL (ref 150–450)
RBC: 4.65 x10E6/uL (ref 4.14–5.80)
RDW: 12.3 % (ref 11.6–15.4)
WBC: 7.6 10*3/uL (ref 3.4–10.8)

## 2019-07-21 LAB — LIPID PANEL
Chol/HDL Ratio: 3.1 ratio (ref 0.0–5.0)
Cholesterol, Total: 158 mg/dL (ref 100–199)
HDL: 51 mg/dL (ref >39)
LDL Calculated: 79 mg/dL (ref 0–99)
Triglycerides: 138 mg/dL (ref 0–149)
VLDL Cholesterol Cal: 28 mg/dL (ref 5–40)

## 2019-07-26 ENCOUNTER — Other Ambulatory Visit: Payer: Self-pay

## 2019-07-26 DIAGNOSIS — Z1211 Encounter for screening for malignant neoplasm of colon: Secondary | ICD-10-CM

## 2019-08-31 ENCOUNTER — Telehealth: Payer: Self-pay | Admitting: Cardiovascular Disease

## 2019-08-31 NOTE — Telephone Encounter (Signed)
LVM for patient to call back to schedule new patient appt with Dr. Gwenlyn Found for CP.

## 2019-09-22 ENCOUNTER — Other Ambulatory Visit: Payer: Self-pay | Admitting: Emergency Medicine

## 2019-09-22 DIAGNOSIS — R7303 Prediabetes: Secondary | ICD-10-CM

## 2019-09-28 ENCOUNTER — Other Ambulatory Visit: Payer: Self-pay | Admitting: Emergency Medicine

## 2019-09-28 DIAGNOSIS — I10 Essential (primary) hypertension: Secondary | ICD-10-CM

## 2019-10-27 ENCOUNTER — Telehealth: Payer: Self-pay | Admitting: Emergency Medicine

## 2019-10-27 NOTE — Telephone Encounter (Signed)
LVM to r/s appt for 01/19/2020 with Dr. Mitchel Honour, because provider is going to be out of the office

## 2020-01-19 ENCOUNTER — Ambulatory Visit: Payer: 59 | Admitting: Emergency Medicine

## 2020-01-22 ENCOUNTER — Other Ambulatory Visit: Payer: Self-pay | Admitting: Emergency Medicine

## 2020-01-22 DIAGNOSIS — I10 Essential (primary) hypertension: Secondary | ICD-10-CM

## 2020-01-22 MED ORDER — LISINOPRIL 10 MG PO TABS: 10.0000 mg | ORAL_TABLET | Freq: Every day | ORAL | 1 refills | Status: AC

## 2021-04-03 ENCOUNTER — Other Ambulatory Visit: Payer: Self-pay | Admitting: Emergency Medicine

## 2021-04-03 DIAGNOSIS — I10 Essential (primary) hypertension: Secondary | ICD-10-CM

## 2023-11-02 ENCOUNTER — Emergency Department (HOSPITAL_COMMUNITY)
Admission: EM | Admit: 2023-11-02 | Discharge: 2023-11-02 | Payer: Self-pay | Attending: Emergency Medicine | Admitting: Emergency Medicine

## 2023-11-02 ENCOUNTER — Emergency Department (HOSPITAL_COMMUNITY): Payer: Self-pay | Admitting: Registered Nurse

## 2023-11-02 ENCOUNTER — Encounter (HOSPITAL_COMMUNITY): Payer: Self-pay

## 2023-11-02 ENCOUNTER — Other Ambulatory Visit: Payer: Self-pay

## 2023-11-02 ENCOUNTER — Emergency Department (HOSPITAL_COMMUNITY): Payer: Self-pay

## 2023-11-02 ENCOUNTER — Emergency Department (EMERGENCY_DEPARTMENT_HOSPITAL): Payer: Self-pay | Admitting: Registered Nurse

## 2023-11-02 DIAGNOSIS — Z79899 Other long term (current) drug therapy: Secondary | ICD-10-CM | POA: Insufficient documentation

## 2023-11-02 DIAGNOSIS — Z7982 Long term (current) use of aspirin: Secondary | ICD-10-CM | POA: Insufficient documentation

## 2023-11-02 DIAGNOSIS — R221 Localized swelling, mass and lump, neck: Secondary | ICD-10-CM

## 2023-11-02 DIAGNOSIS — I10 Essential (primary) hypertension: Secondary | ICD-10-CM | POA: Insufficient documentation

## 2023-11-02 DIAGNOSIS — K122 Cellulitis and abscess of mouth: Secondary | ICD-10-CM | POA: Insufficient documentation

## 2023-11-02 DIAGNOSIS — R220 Localized swelling, mass and lump, head: Secondary | ICD-10-CM

## 2023-11-02 DIAGNOSIS — J988 Other specified respiratory disorders: Secondary | ICD-10-CM

## 2023-11-02 LAB — I-STAT ARTERIAL BLOOD GAS, ED
Acid-base deficit: 3 mmol/L — ABNORMAL HIGH (ref 0.0–2.0)
Bicarbonate: 23.4 mmol/L (ref 20.0–28.0)
Calcium, Ion: 1.18 mmol/L (ref 1.15–1.40)
HCT: 39 % (ref 39.0–52.0)
Hemoglobin: 13.3 g/dL (ref 13.0–17.0)
O2 Saturation: 100 %
Patient temperature: 99.5
Potassium: 4.8 mmol/L (ref 3.5–5.1)
Sodium: 137 mmol/L (ref 135–145)
TCO2: 25 mmol/L (ref 22–32)
pCO2 arterial: 49.2 mm[Hg] — ABNORMAL HIGH (ref 32–48)
pH, Arterial: 7.288 — ABNORMAL LOW (ref 7.35–7.45)
pO2, Arterial: 255 mm[Hg] — ABNORMAL HIGH (ref 83–108)

## 2023-11-02 LAB — URINALYSIS, ROUTINE W REFLEX MICROSCOPIC
Bacteria, UA: NONE SEEN
Bilirubin Urine: NEGATIVE
Glucose, UA: NEGATIVE mg/dL
Hgb urine dipstick: NEGATIVE
Ketones, ur: NEGATIVE mg/dL
Leukocytes,Ua: NEGATIVE
Nitrite: NEGATIVE
Protein, ur: 100 mg/dL — AB
Specific Gravity, Urine: 1.032 — ABNORMAL HIGH (ref 1.005–1.030)
pH: 5 (ref 5.0–8.0)

## 2023-11-02 LAB — BASIC METABOLIC PANEL
Anion gap: 10 (ref 5–15)
BUN: 8 mg/dL (ref 6–20)
CO2: 22 mmol/L (ref 22–32)
Calcium: 9.3 mg/dL (ref 8.9–10.3)
Chloride: 104 mmol/L (ref 98–111)
Creatinine, Ser: 1.09 mg/dL (ref 0.61–1.24)
GFR, Estimated: >60 mL/min (ref >60)
Glucose, Bld: 142 mg/dL — ABNORMAL HIGH (ref 70–99)
Potassium: 3.8 mmol/L (ref 3.5–5.1)
Sodium: 136 mmol/L (ref 135–145)

## 2023-11-02 LAB — CBC
HCT: 41.5 % (ref 39.0–52.0)
Hemoglobin: 13.2 g/dL (ref 13.0–17.0)
MCH: 26.4 pg (ref 26.0–34.0)
MCHC: 31.8 g/dL (ref 30.0–36.0)
MCV: 83 fL (ref 80.0–100.0)
Platelets: 309 10*3/uL (ref 150–400)
RBC: 5 MIL/uL (ref 4.22–5.81)
RDW: 12.9 % (ref 11.5–15.5)
WBC: 15.1 10*3/uL — ABNORMAL HIGH (ref 4.0–10.5)
nRBC: 0 % (ref 0.0–0.2)

## 2023-11-02 LAB — I-STAT CG4 LACTIC ACID, ED: Lactic Acid, Venous: 1 mmol/L (ref 0.5–1.9)

## 2023-11-02 MED ORDER — SODIUM CHLORIDE 0.9 % IV SOLN
3.0000 g | Freq: Once | INTRAVENOUS | Status: AC
  Administered 2023-11-02: 3 g via INTRAVENOUS
  Filled 2023-11-02: qty 8

## 2023-11-02 MED ORDER — LIDOCAINE 2% (20 MG/ML) 5 ML SYRINGE
INTRAMUSCULAR | Status: DC | PRN
  Administered 2023-11-02: 100 mg via INTRAVENOUS

## 2023-11-02 MED ORDER — PROPOFOL 10 MG/ML IV BOLUS
INTRAVENOUS | Status: AC | PRN
  Administered 2023-11-02: 30 ug via INTRAVENOUS
  Administered 2023-11-02: 50 ug via INTRAVENOUS

## 2023-11-02 MED ORDER — SODIUM CHLORIDE 0.9 % IV BOLUS
1000.0000 mL | Freq: Once | INTRAVENOUS | Status: AC
  Administered 2023-11-02: 1000 mL via INTRAVENOUS

## 2023-11-02 MED ORDER — IOHEXOL 350 MG/ML SOLN
90.0000 mL | Freq: Once | INTRAVENOUS | Status: AC | PRN
  Administered 2023-11-02: 90 mL via INTRAVENOUS

## 2023-11-02 MED ORDER — FENTANYL 2500MCG IN NS 250ML (10MCG/ML) PREMIX INFUSION
50.0000 ug/h | INTRAVENOUS | Status: DC
  Administered 2023-11-02: 150 ug/h via INTRAVENOUS
  Filled 2023-11-02 (×2): qty 250

## 2023-11-02 MED ORDER — MIDAZOLAM-SODIUM CHLORIDE 100-0.9 MG/100ML-% IV SOLN
0.5000 mg/h | INTRAVENOUS | Status: DC
  Administered 2023-11-02: 0.5 mg/h via INTRAVENOUS
  Filled 2023-11-02: qty 100

## 2023-11-02 MED ORDER — FENTANYL BOLUS VIA INFUSION: 50.0000 ug | INTRAVENOUS | Status: DC | PRN

## 2023-11-02 MED ORDER — NOREPINEPHRINE 4 MG/250ML-% IV SOLN: 0.0000 ug/min | INTRAVENOUS | Status: DC

## 2023-11-02 MED ORDER — FENTANYL CITRATE (PF) 100 MCG/2ML IJ SOLN
INTRAMUSCULAR | Status: AC | PRN
  Administered 2023-11-02 (×2): 100 ug via INTRAVENOUS

## 2023-11-02 MED ORDER — NOREPINEPHRINE 4 MG/250ML-% IV SOLN
INTRAVENOUS | Status: AC
  Administered 2023-11-02: 2 ug/min via INTRAVENOUS
  Filled 2023-11-02: qty 250

## 2023-11-02 MED ORDER — METHYLPREDNISOLONE SODIUM SUCC 125 MG IJ SOLR
125.0000 mg | Freq: Once | INTRAMUSCULAR | Status: AC
  Administered 2023-11-02: 125 mg via INTRAVENOUS
  Filled 2023-11-02: qty 2

## 2023-11-02 MED ORDER — LACTATED RINGERS IV SOLN
INTRAVENOUS | Status: DC | PRN
Start: 2023-11-02 — End: 2023-11-02

## 2023-11-02 MED ORDER — PROPOFOL 1000 MG/100ML IV EMUL
INTRAVENOUS | Status: AC | PRN
  Administered 2023-11-02: 30 ug/kg/min via INTRAVENOUS

## 2023-11-02 MED ORDER — SUCCINYLCHOLINE CHLORIDE 200 MG/10ML IV SOSY
PREFILLED_SYRINGE | INTRAVENOUS | Status: DC | PRN
  Administered 2023-11-02: 120 mg via INTRAVENOUS

## 2023-11-02 MED ORDER — PROPOFOL 10 MG/ML IV BOLUS
INTRAVENOUS | Status: DC | PRN
  Administered 2023-11-02: 200 mg via INTRAVENOUS

## 2023-11-02 MED ORDER — PROPOFOL 1000 MG/100ML IV EMUL
5.0000 ug/kg/min | INTRAVENOUS | Status: DC
  Administered 2023-11-02: 30 ug/kg/min via INTRAVENOUS
  Filled 2023-11-02 (×4): qty 100

## 2023-11-02 NOTE — Code Documentation (Signed)
X-ray at bedside

## 2023-11-02 NOTE — Progress Notes (Signed)
Pt transported to CT and back on vent without any complications.

## 2023-11-02 NOTE — ED Notes (Signed)
Patient transported to CT with RN and RT on monitor 

## 2023-11-02 NOTE — ED Notes (Signed)
Report given to [facility] transport and RN taking over care.    Patient going to [facility] room [phone].   Number for [facility] [phone]

## 2023-11-02 NOTE — ED Notes (Signed)
Family updated as to patient's status.

## 2023-11-02 NOTE — ED Triage Notes (Signed)
Pt c/o swelling and pain to L neck and jaw since this am; NKA; respirations even and unlabored, no distress in triage, speaking in clear sentences; endorses fevers and sweats at home; neck warm to touch, endorses difficulty swallowing

## 2023-11-02 NOTE — ED Notes (Signed)
Report called to [facility] transfer center and [facility] nurse [place].  Patient going to room 6915.   [place] direct line [phone]

## 2023-11-02 NOTE — ED Notes (Addendum)
Patients nephew Paul Morgan is going to take his belongings with him.   Clothing,wallet, and cell phone

## 2023-11-02 NOTE — ED Notes (Signed)
Report given to RN and transfer department at [facility].   RN- Hadley Pen- direct line [phone]  Patient is going to [facility] room [phone]

## 2023-11-02 NOTE — ED Provider Triage Note (Signed)
Emergency Medicine Provider Triage Evaluation Note  Paul Morgan , a 57 y.o. male  was evaluated in triage.  Pt complains of soft tissue swelling, pain of left neck and jaw since this morning, did have some sore throat over the last few days.  Not in acute distress at this time, but does endorse fever and sweats at home, and endorses difficulty swallowing.  Had previously been taking lisinopril but does not take it at this time.  No previous history of oropharyngeal swelling.  Review of Systems  Positive: Throat swelling, difficulty swallowing, fever, chills Negative: Stridor  Physical Exam  BP (!) 160/91   Pulse (!) 108   Temp 99.5 F (37.5 C) (Oral)   Resp 16   SpO2 100%  Gen:   Awake, no distress   Resp:  Normal effort, no stridor MSK:   Moves extremities without difficulty  Other:  Patient with significant soft tissue swelling of the neck, swollen uvula, but tonsils are normal-appearing, tongue is normal-appearing without swelling.  No current stridor.  He does have a hot potato voice, he does have some trismus.  He is tachycardic with borderline temperature in triage.  Medical Decision Making  Medically screening exam initiated at 3:38 PM.  Appropriate orders placed.  Paul Morgan was informed that the remainder of the evaluation will be completed by another provider, this initial triage assessment does not replace that evaluation, and the importance of remaining in the ED until their evaluation is complete.  Nursing staff advised that patient should not be moved back to the lobby, should have next available room due to concerning upper respiratory exam, swelling   Olene Floss, PA-C 11/02/23 1538

## 2023-11-02 NOTE — Code Documentation (Signed)
ETT tube in with color change. 23 at the lip.

## 2023-11-02 NOTE — Code Documentation (Signed)
Anesthesia team at bedside for intubation.

## 2023-11-02 NOTE — ED Notes (Signed)
Propofol 1000mg /170ml and Fentanyl in NS 250 ml provided to [facility] personnel for transport.

## 2023-11-02 NOTE — ED Notes (Signed)
Patient returned to room from CT. 

## 2023-11-02 NOTE — Anesthesia Procedure Notes (Signed)
Procedure Name: Intubation Date/Time: 11/02/2023 4:32 PM  Performed by: Laruth Bouchard., CRNAPre-anesthesia Checklist: Patient identified, Emergency Drugs available, Suction available, Patient being monitored and Timeout performed Patient Re-evaluated:Patient Re-evaluated prior to induction Oxygen Delivery Method: Ambu bag Preoxygenation: Pre-oxygenation with 100% oxygen Induction Type: IV induction, Rapid sequence and Cricoid Pressure applied Laryngoscope Size: Glidescope and 4 Grade View: Grade I Tube type: Oral Tube size: 8.0 mm Number of attempts: 1 Airway Equipment and Method: Rigid stylet and Video-laryngoscopy Placement Confirmation: ETT inserted through vocal cords under direct vision, positive ETCO2, breath sounds checked- equal and bilateral and CO2 detector Secured at: 24 cm Tube secured with: Tape Dental Injury: Teeth and Oropharynx as per pre-operative assessment

## 2023-11-02 NOTE — ED Provider Notes (Signed)
[facility] EMERGENCY DEPARTMENT AT [facility] Provider Note   CSN: [id number] Arrival date & time: 11/02/23  1516     History  Chief Complaint  Patient presents with   Throat Swelling    Paul Morgan is a 57 y.o. male Pt complains of soft tissue swelling, pain of left neck and jaw since this morning, did have some sore throat over the last few days.  Not in acute distress at this time, but does endorse fever and sweats at home, and endorses difficulty swallowing.  Had previously been taking lisinopril but does not take it at this time.  No previous history of oropharyngeal swelling.   HPI     Home Medications Prior to Admission medications   Medication Sig Start Date End Date Taking? Authorizing Provider  amLODipine (NORVASC) 10 MG tablet TAKE 1 TABLET(10 MG) BY MOUTH DAILY 04/03/21   Georgina Quint, MD  aspirin EC 81 MG tablet Take 81 mg by mouth daily.    [provider]  Aspirin-Acetaminophen-Caffeine (GOODY HEADACHE PO) Take 1 packet by mouth daily as needed (headaches).    [provider]  docusate sodium (COLACE) 100 MG capsule Take 1 capsule (100 mg total) by mouth 2 (two) times daily. Patient not taking: Reported on 07/20/2019 06/02/16   Ditty, Loura Halt, MD  gabapentin (NEURONTIN) 300 MG capsule Take 1 capsule (300 mg total) by mouth 3 (three) times daily. Patient not taking: Reported on 07/20/2019 06/02/16   Ditty, Loura Halt, MD  glucose blood (ACCU-CHEK AVIVA PLUS) test strip Use as instructed to test blood sugar daily. 07/20/19   Georgina Quint, MD  glucose blood test strip 1 each by Other route daily. Use as instructed    [provider]  lisinopril (ZESTRIL) 10 MG tablet Take 1 tablet (10 mg total) by mouth daily. 01/22/20 04/21/20  Georgina Quint, MD  loratadine (CLARITIN) 10 MG tablet Take 10 mg by mouth daily as needed for allergies.     [provider]  metFORMIN (GLUCOPHAGE) 500 MG tablet TAKE 1  TABLET(500 MG) BY MOUTH TWICE DAILY WITH A MEAL 09/22/19   Sagardia, Eilleen Kempf, MD  methocarbamol (ROBAXIN) 750 MG tablet Take 1 tablet (750 mg total) by mouth 4 (four) times daily. Patient not taking: Reported on 07/20/2019 06/02/16   Ditty, Loura Halt, MD  Multiple Vitamin (MULTIVITAMIN) tablet Take 1 tablet by mouth daily.    [provider]  oxyCODONE-acetaminophen (ROXICET) 5-325 MG tablet Take 1-2 tablets by mouth every 6 (six) hours as needed for severe pain. Patient not taking: Reported on 07/20/2019 06/02/16   Ditty, Loura Halt, MD      Allergies    Patient has no known allergies.    Review of Systems   Review of Systems  HENT:  Positive for dental problem, facial swelling and trouble swallowing.   All other systems reviewed and are negative.   Physical Exam Updated Vital Signs BP 115/73   Pulse 73   Temp 98 F (36.7 C) (Axillary)   Resp 20   Ht 5\' 9"  (1.753 m)   Wt 100 kg   SpO2 100%   BMI 32.56 kg/m  Physical Exam Vitals and nursing note reviewed.  Constitutional:      General: He is not in acute distress.    Appearance: Normal appearance. He is ill-appearing.  HENT:     Head: Normocephalic and atraumatic.     Mouth/Throat:     Comments:  Patient with significant  soft tissue swelling of the neck, swollen uvula, but tonsils are normal-appearing, tongue is normal-appearing without swelling.  No current stridor.  He does have a hot potato voice, he does have some trismus.  Clinic suspicious for developing Ludwig angina Eyes:     General:        Right eye: No discharge.        Left eye: No discharge.  Cardiovascular:     Rate and Rhythm: Regular rhythm. Tachycardia present.     Heart sounds: No murmur heard.    No friction rub. No gallop.  Pulmonary:     Effort: Pulmonary effort is normal.     Breath sounds: Normal breath sounds.  Abdominal:     General: Bowel sounds are normal.     Palpations: Abdomen is soft.  Skin:    General: Skin is warm  and dry.     Capillary Refill: Capillary refill takes less than 2 seconds.  Neurological:     Mental Status: He is alert and oriented to person, place, and time.  Psychiatric:        Mood and Affect: Mood normal.        Behavior: Behavior normal.     ED Results / Procedures / Treatments   Labs (all labs ordered are listed, but only abnormal results are displayed) Labs Reviewed  CBC - Abnormal; Notable for the following components:      Result Value   WBC 15.1 (*)    All other components within normal limits  BASIC METABOLIC PANEL - Abnormal; Notable for the following components:   Glucose, Bld 142 (*)    All other components within normal limits  URINALYSIS, ROUTINE W REFLEX MICROSCOPIC - Abnormal; Notable for the following components:   Color, Urine AMBER (*)    Specific Gravity, Urine 1.032 (*)    Protein, ur 100 (*)    All other components within normal limits  I-STAT ARTERIAL BLOOD GAS, ED - Abnormal; Notable for the following components:   pH, Arterial 7.288 (*)    pCO2 arterial 49.2 (*)    pO2, Arterial 255 (*)    Acid-base deficit 3.0 (*)    All other components within normal limits  CULTURE, BLOOD (ROUTINE X 2)  CULTURE, BLOOD (ROUTINE X 2)  BLOOD GAS, ARTERIAL  I-STAT CG4 LACTIC ACID, ED  I-STAT CG4 LACTIC ACID, ED    EKG None  Radiology DG Chest Portable 1 View  Result Date: 11/02/2023 CLINICAL DATA:  Endotracheal tube placement. EXAM: PORTABLE CHEST 1 VIEW COMPARISON:  07/16/2017. FINDINGS: Heart is enlarged. There is widening of the mediastinal contour, likely related to low lung volumes. Mild airspace disease is noted in the mid to lower lung fields bilaterally. No effusion or pneumothorax. An endotracheal tube terminates 3.9 cm above the carina. No acute osseous abnormality is seen. IMPRESSION: 1. Endotracheal tube terminates 3.9 cm above the carina. 2. Low lung volumes with atelectasis or infiltrate at the lung bases. Electronically Signed   By: Thornell Sartorius M.D.   On: 11/02/2023 21:01   CT Soft Tissue Neck W Contrast  Result Date: 11/02/2023 CLINICAL DATA:  Epiglottitis or tonsillitis suspected EXAM: CT NECK WITH CONTRAST TECHNIQUE: Multidetector CT imaging of the neck was performed using the standard protocol following the bolus administration of intravenous contrast. RADIATION DOSE REDUCTION: This exam was performed according to the departmental dose-optimization program which includes automated exposure control, adjustment of the mA and/or kV according to patient size and/or use of iterative reconstruction  technique. CONTRAST:  90mL OMNIPAQUE IOHEXOL 350 MG/ML SOLN COMPARISON:  03/05/2005 FINDINGS: Periapical lucency about roots of the left third mandibular molar (series 6, image 55 and series 8, image 60), without definite peripherally enhancing collection in the adjacent buccal soft tissues. Enlargement of the anterior belly of the digastric bilaterally (series 7, image 50 and series 8, image 84). Edema in the base of tongue (series 8, image 73 and series 6, image 72). Fat stranding in the submental and submandibular space fat, left greater than right, and superficial facial soft tissue of left lower face (series 8, image 80), with thickening of the overlying platysma. Pharynx and larynx: Evaluation is somewhat limited by fluid in the nasopharynx and oropharynx, related to intubation, as well as beam hardening artifact from the patient's ACDF. Within this limitation, negative for focal mass or swelling in the pharynx and larynx. The epiglottis is not well evaluated. Salivary glands: No inflammation, mass, or stone. Thyroid: 1.8 cm hypoenhancing lesion in the isthmus (series 8, image 100). Lymph nodes: Prominent level 1B lymph nodes, which measure up to 7 mm in short axis Vascular: Patent major arteries and veins in the neck. Limited intracranial: Negative. Visualized orbits: Negative. Mastoids and visualized paranasal sinuses: Mucosal thickening in the  left greater than right ethmoid air cells and left greater than right maxillary sinus. The mastoids are well aerated. Skeleton: No acute fracture or suspicious osseous lesion. Status post ACDF C4-C6. Upper chest: Negative. IMPRESSION: 1. Periapical lucency about the roots of the left third mandibular molar, without focal abscess in the adjacent soft tissue, with edema centered in the base of tongue, anterior belly of the digastric, and submandibular space, concerning for odontogenic infection extending into the floor the mouth, sublingual, submental, and submandibular spaces, as can be seen with Ludwig's angina. 2. Prominent level 1B lymph nodes, which measure up to 7 mm in short axis, which are likely reactive. 3. 1.8 cm hypoenhancing lesion in the thyroid isthmus. If this has not previously been evaluated, a non-emergent ultrasound of the thyroid is recommended. (Reference: J Am Coll Radiol. 2015 Feb;12(2): 143-50) Electronically Signed   By: Wiliam Ke M.D.   On: 11/02/2023 19:54    Procedures .Critical Care  Performed by: Olene Floss, PA-C Authorized by: Olene Floss, PA-C   Critical care provider statement:    Critical care time (minutes):  30   Critical care was necessary to treat or prevent imminent or life-threatening deterioration of the following conditions:  Respiratory failure (Critical airway, requiring emergent intubation)   Critical care was time spent personally by me on the following activities:  Development of treatment plan with patient or surrogate, discussions with consultants, evaluation of patient's response to treatment, examination of patient, ordering and review of laboratory studies, ordering and review of radiographic studies, ordering and performing treatments and interventions, pulse oximetry, re-evaluation of patient's condition and review of old charts   Care discussed with: accepting provider at another facility       Medications Ordered in  ED Medications  fentaNYL in NS (50mcg/ml) infusion-PREMIX (200 mcg/hr Intravenous Rate/Dose Change 11/02/23 1732)  fentaNYL (SUBLIMAZE) bolus via infusion 50-100 mcg (has no administration in time range)  propofol (DIPRIVAN) 1000 MG/100ML infusion (20 mcg/kg/min  100 kg Intravenous Infusion Verify 11/02/23 1756)  norepinephrine (LEVOPHED) 4mg  in (0.016 mg/mL) premix infusion (2 mcg/min Intravenous New Bag/Given 11/02/23 1743)  midazolam (VERSED) 100 mg/100 mL (1 mg/mL) premix infusion (0.5 mg/hr Intravenous New Bag/Given 11/02/23 1825)  sodium  chloride 0.9 % bolus 1,000 mL (0 mLs Intravenous Stopped 11/02/23 1710)  methylPREDNISolone sodium succinate (SOLU-MEDROL) 125 mg/2 mL injection 125 mg (125 mg Intravenous Given 11/02/23 1549)  Ampicillin-Sulbactam (UNASYN) 3 g in sodium chloride 0.9 % 100 mL IVPB (0 g Intravenous Stopped 11/02/23 1635)  propofol (DIPRIVAN) 1000 MG/100ML infusion (0 mcg/kg/min  100 kg Intravenous Stopped 11/02/23 1708)  propofol (DIPRIVAN) 10 mg/mL bolus/IV push (30 mcg Intravenous Given 11/02/23 1640)  fentaNYL (SUBLIMAZE) injection (100 mcg Intravenous Given 11/02/23 1654)  iohexol (OMNIPAQUE) 350 MG/ML injection 90 mL (90 mLs Intravenous Contrast Given 11/02/23 1852)    ED Course/ Medical Decision Making/ A&P Clinical Course as of 11/02/23 2223  Tue Nov 02, 2023  1602 Dr Elijah Birk recommends transfer to center with oral surgery.  [RP]  1623 Dr Isaias Cowman from anesthesia at the bedside.  Patient consented for intubation and possible transfer if indicated based on his CT scan. [RP]  1638 Patient emergently intubated for Ludwig angina, ct soft tissue neck pending [CP]  [id number] transfer center -- spoke with Dr. Chestine Spore with ENT who will put in a bed request for ICU, reports that they are unlikely to have any surgical management, will review images and call back --they will reach out to their MICU at this time to reassess potential availability of beds  [CP]  2114 Dr Gaynell Face consulted with critical care who reports that since ENT is not comfortable taking a odontogenic infection they would prefer not to admit this patient unless no other options available, will admit or consult on ventilator management as needed if no other options available [CP]  2130 [facility] transfer center reports that they do not have any inpatient dentistry, will reach out to [facility] who does have inpatient dentistry [CP]  2133 [facility] -- mike with transfer center will reach out [CP]  2151 Dr. Bevelyn Ngo, with [facility] ENT recommends OMFS, spoke with Dr. Reside with OMFS, who will reach out to the surgical ICU at [facility] about admission, they are going to call back to confirm placement [CP]    Clinical Course User Index [CP] Olene Floss, PA-C [RP] Rondel Baton, MD                                 Medical Decision Making Amount and/or Complexity of Data Reviewed Labs: ordered. Radiology: ordered.  Risk Prescription drug management.   This patient is a 57 y.o. male who presents to the ED for concern of throat swellign, this involves an extensive number of treatment options, and is a complaint that carries with it a high risk of complications and morbidity. The emergent differential diagnosis prior to evaluation includes, but is not limited to, epiglottitis, tonsillitis, Ludwig's angina, angioedema, including acute airway compromise, vs other. This is not an exhaustive differential.   Past Medical History / Co-morbidities / Social History: Hypertension, prediabetes, otherwise overall unremarkable past medical history  Additional history: Chart reviewed. Pertinent results include: Reviewed lab work, imaging hospital admission for cervical spondylosis, discectomy  Physical Exam: Physical exam performed. The pertinent findings include: Patient with significant soft tissue swelling, induration of the neck, swollen uvula, but tonsils are normal-appearing, tongue is  normal-appearing without swelling.  No current stridor.  He does have a hot potato voice, he does have some trismus.  Clinic suspicious for developing Ludwig angina  Lab Tests: I ordered, and personally interpreted labs.  The pertinent results include: ABG with acidemia, pH 7.28,  UA with high specific gravity 1.032, otherwise unremarkable, BMP unremarkable, CBC notable for leukocytosis, white blood cell 17.1   Imaging Studies: I ordered imaging studies including CT soft tissue neck with contrast, plain film chest x-ray. I independently visualized and interpreted imaging which showed  . Periapical lucency about the roots of the left third mandibular  molar, without focal abscess in the adjacent soft tissue, with edema  centered in the base of tongue, anterior belly of the digastric, and  submandibular space, concerning for odontogenic infection extending  into the floor the mouth, sublingual, submental, and submandibular  spaces, as can be seen with Ludwig's angina.  2. Prominent level 1B lymph nodes, which measure up to 7 mm in short  axis, which are likely reactive.  3. 1.8 cm hypoenhancing lesion in the thyroid isthmus. If this has  not previously been evaluated, a non-emergent ultrasound of the  thyroid is recommended. (Reference: J Am Coll Radiol. 2015  . I agree with the radiologist interpretation.   Medications: I ordered medication including initially administered Solu-Medrol, Unasyn, fluid bolus, fentanyl for concern of developing Ludwig angina, patient needed to be emergently intubated, placed on propofol, Levophed, fentanyl, and Versed drips, he was agitated on propofol, Levophed and fentanyl alone and required additional sedation with Versed, but has been stable since then  Consultations Obtained: I requested consultation with the ENT physician who emergently came to evaluate patient at bedside, decision was made to intubate him,,  and discussed lab and imaging findings as well as  pertinent plan - they recommend: Consult to the oral and maxillofacial surgeon at [facility] for admission to ICU level care --please see extended ED course notes about transfer, ultimately patient transferred to [facility] surgical ICU under the care of Dr. Reside with oral maxillofacial surgery   Disposition: After consideration of the diagnostic results and the patients response to treatment, I feel that patient will need ICU level admission for odontogenic infection, Ludwig angina.   I discussed this case with my attending physician Dr. Eloise Harman who cosigned this note including patient's presenting symptoms, physical exam, and planned diagnostics and interventions. Attending physician stated agreement with plan or made changes to plan which were implemented.    Final Clinical Impression(s) / ED Diagnoses Final diagnoses:  Ludwig angina    Rx / DC Orders ED Discharge Orders     None         [place] 11/02/23 2223    Lonell Grandchild, MD 11/02/23 2251

## 2023-11-02 NOTE — Consult Note (Signed)
Reason for Consult: Airway obstruction Referring Physician: ER attending  Paul Morgan is an 57 y.o. male.  HPI: Paul Morgan is a 57 year old with recent history of tooth ache and dental infection who presents to the emergency room with neck swelling and difficulty breathing.  I was called emergently to evaluate his airway in light of a likely diagnosis of Ludwig's angina.  No scan has been performed yet.  Past Medical History:  Diagnosis Date   Anxiety    Cervical spondylosis with myelopathy    Family history of adverse reaction to anesthesia    " My dad was a little nauseous after he woke up."   Hydrocele 02/2010   Pneumonia    hx of as a child     Past Surgical History:  Procedure Laterality Date   ANTERIOR CERVICAL DECOMP/DISCECTOMY FUSION N/A 06/02/2016   Procedure: Cervical four - five Cervical five - six Anterior cervical decompression/diskectomy/fusion;  Surgeon: Loura Halt Ditty, MD;  Location: [facility];  Service: Neurosurgery;  Laterality: N/A;  C4-5 C5-6 Anterior cervical decompression/diskectomy/fusion   HYDROCELE EXCISION Bilateral 09/12/2014   Procedure: BILATERAL HYDROCELECTOMY ADULT;  Surgeon: Crist Fat, MD;  Location: [facility];  Service: Urology;  Laterality: Bilateral;    Family History  Problem Relation Age of Onset   Diabetes Mother    COPD Mother    Cancer Father    Heart disease Sister    Drug abuse Brother    Diabetes Brother    COPD Brother    Cancer Sister    Diabetes Brother     Social History:  reports that he has been smoking cigarettes. He started smoking about 26 years ago. He has a 26.6 pack-year smoking history. He has never used smokeless tobacco. He reports current alcohol use. He reports that he does not use drugs.  Allergies: No Known Allergies  Medications: I have reviewed the patient's current medications.  Results for orders placed or performed during the hospital encounter of 11/02/23 (from the past 48 hour(s))  CBC      Status: Abnormal   Collection Time: 11/02/23  3:46 PM  Result Value Ref Range   WBC 15.1 (H) 4.0 - 10.5 K/uL   RBC 5.00 4.22 - 5.81 MIL/uL   Hemoglobin 13.2 13.0 - 17.0 g/dL   HCT 16.1 09.6 - 04.5 %   MCV 83.0 80.0 - 100.0 fL   MCH 26.4 26.0 - 34.0 pg   MCHC 31.8 30.0 - 36.0 g/dL   RDW 40.9 81.1 - 91.4 %   Platelets 309 150 - 400 K/uL   nRBC 0.0 0.0 - 0.2 %    Comment: Performed at [facility], [address], [place], [place] [address]  Basic metabolic panel     Status: Abnormal   Collection Time: 11/02/23  3:46 PM  Result Value Ref Range   Sodium 136 135 - 145 mmol/L   Potassium 3.8 3.5 - 5.1 mmol/L   Chloride 104 98 - 111 mmol/L   CO2 22 22 - 32 mmol/L   Glucose, Bld 142 (H) 70 - 99 mg/dL    Comment: Glucose reference range applies only to samples taken after fasting for at least 8 hours.   BUN 8 6 - 20 mg/dL   Creatinine, Ser 6.21 0.61 - 1.24 mg/dL   Calcium 9.3 8.9 - 30.8 mg/dL   GFR, Estimated >65 >78 mL/min    Comment: (NOTE) Calculated using the CKD-EPI Creatinine Equation (2021)    Anion gap  10 5 - 15    Comment: Performed at [facility], [address], [place], [place] [address]  I-Stat CG4 Lactic Acid     Status: None   Collection Time: 11/02/23  3:54 PM  Result Value Ref Range   Lactic Acid, Venous 1.0 0.5 - 1.9 mmol/L    No results found.  Review of Systems Blood pressure (!) 165/94, pulse (!) 103, temperature 99.5 F (37.5 C), temperature source Oral, resp. rate (!) 22, SpO2 100%.  Physical Exam Patient is cooperative with a hot potato muffled voice and obvious difficulties with breathing. Face atraumatic without bony step-offs Submental region and submandibular region of the neck is swollen Floor of mouth has watery edema consistent with Ludwig's angina Patient has normal tongue mobility and no tongue swelling [place] normal without mastoid tenderness External nose normal with midline septum Patient has a prominent thyroid isthmus but no  palpable mass or nodule  Assessment/Plan:  Neck swelling Acute airway obstruction  This is all likely secondary to a dental infection.  Given his rapidly progressive swelling and difficulties breathing, I recommended intubation immediately.  This was performed by anesthesia.  I was at the bedside for airway support if needed.  Intubation went well and was without complication.  This patient should have a CT scan with IV contrast.  If this patient has a neck infection of odontogenic origin as suspected, treatment of the dental source is imperative.  [facility] does not provide dental or oral surgery services and therefore the patient should be transferred to another hospital where those services can be provided.  Now that the patient is intubated, his airway is secure.  Trixie Dredge. 11/02/2023, 4:37 PM

## 2023-11-07 LAB — CULTURE, BLOOD (ROUTINE X 2): Culture: NO GROWTH
